# Patient Record
Sex: Female | Born: 1935 | Race: White | Hispanic: No | Smoking: Never smoker
Health system: Southern US, Community
[De-identification: ages and names within clinical notes are randomized; demographics above are authoritative.]

## PROBLEM LIST (undated history)

## (undated) DIAGNOSIS — N183 Chronic kidney disease, stage 3 unspecified: Secondary | ICD-10-CM

## (undated) DIAGNOSIS — M129 Arthropathy, unspecified: Secondary | ICD-10-CM

## (undated) DIAGNOSIS — M109 Gout, unspecified: Secondary | ICD-10-CM

## (undated) DIAGNOSIS — J45909 Unspecified asthma, uncomplicated: Secondary | ICD-10-CM

## (undated) DIAGNOSIS — Z862 Personal history of diseases of the blood and blood-forming organs and certain disorders involving the immune mechanism: Secondary | ICD-10-CM

## (undated) DIAGNOSIS — K5733 Diverticulitis of large intestine without perforation or abscess with bleeding: Secondary | ICD-10-CM

## (undated) DIAGNOSIS — E785 Hyperlipidemia, unspecified: Secondary | ICD-10-CM

## (undated) DIAGNOSIS — K219 Gastro-esophageal reflux disease without esophagitis: Secondary | ICD-10-CM

## (undated) DIAGNOSIS — I639 Cerebral infarction, unspecified: Secondary | ICD-10-CM

## (undated) DIAGNOSIS — N2889 Other specified disorders of kidney and ureter: Secondary | ICD-10-CM

## (undated) DIAGNOSIS — I10 Essential (primary) hypertension: Secondary | ICD-10-CM

## (undated) HISTORY — DX: Essential (primary) hypertension: I10

## (undated) HISTORY — DX: Chronic kidney disease, stage 3 (moderate): N18.3

## (undated) HISTORY — DX: Gastro-esophageal reflux disease without esophagitis: K21.9

## (undated) HISTORY — DX: Personal history of diseases of the blood and blood-forming organs and certain disorders involving the immune mechanism: Z86.2

## (undated) HISTORY — DX: Chronic kidney disease, stage 3 unspecified: N18.30

## (undated) HISTORY — DX: Unspecified asthma, uncomplicated: J45.909

## (undated) HISTORY — DX: Diverticulitis of large intestine without perforation or abscess with bleeding: K57.33

## (undated) HISTORY — DX: Hyperlipidemia, unspecified: E78.5

## (undated) HISTORY — DX: Arthropathy, unspecified: M12.9

## (undated) HISTORY — PX: BACK SURGERY: SHX140

## (undated) HISTORY — DX: Cerebral infarction, unspecified: I63.9

## (undated) HISTORY — PX: WRIST SURGERY: SHX841

## (undated) HISTORY — DX: Gout, unspecified: M10.9

## (undated) HISTORY — PX: DENTAL SURGERY: SHX609

## (undated) HISTORY — DX: Other specified disorders of kidney and ureter: N28.89

## (undated) HISTORY — PX: KNEE SURGERY: SHX244

---

## 2001-09-17 ENCOUNTER — Encounter: Payer: Self-pay | Admitting: Unknown Physician Specialty

## 2001-09-17 ENCOUNTER — Encounter: Admission: RE | Admit: 2001-09-17 | Discharge: 2001-09-17 | Payer: Self-pay | Admitting: Unknown Physician Specialty

## 2004-03-22 ENCOUNTER — Ambulatory Visit: Payer: Self-pay | Admitting: Family Medicine

## 2004-03-30 ENCOUNTER — Ambulatory Visit: Payer: Self-pay | Admitting: Family Medicine

## 2004-04-26 ENCOUNTER — Ambulatory Visit: Payer: Self-pay | Admitting: Family Medicine

## 2004-05-15 ENCOUNTER — Ambulatory Visit: Payer: Self-pay | Admitting: Family Medicine

## 2004-10-30 ENCOUNTER — Ambulatory Visit: Payer: Self-pay | Admitting: Family Medicine

## 2004-12-11 ENCOUNTER — Ambulatory Visit: Payer: Self-pay | Admitting: Family Medicine

## 2005-01-08 ENCOUNTER — Ambulatory Visit: Payer: Self-pay | Admitting: Family Medicine

## 2005-01-10 ENCOUNTER — Ambulatory Visit: Payer: Self-pay | Admitting: Family Medicine

## 2005-01-24 ENCOUNTER — Ambulatory Visit: Payer: Self-pay | Admitting: Family Medicine

## 2005-03-07 ENCOUNTER — Ambulatory Visit: Payer: Self-pay | Admitting: Family Medicine

## 2005-04-02 ENCOUNTER — Ambulatory Visit: Payer: Self-pay | Admitting: Family Medicine

## 2005-05-16 ENCOUNTER — Ambulatory Visit: Payer: Self-pay | Admitting: Family Medicine

## 2005-05-29 ENCOUNTER — Ambulatory Visit: Payer: Self-pay | Admitting: Family Medicine

## 2005-06-04 ENCOUNTER — Ambulatory Visit: Payer: Self-pay | Admitting: Family Medicine

## 2006-10-24 ENCOUNTER — Inpatient Hospital Stay (HOSPITAL_COMMUNITY): Admission: RE | Admit: 2006-10-24 | Discharge: 2006-10-27 | Payer: Self-pay | Admitting: Neurosurgery

## 2007-03-18 ENCOUNTER — Inpatient Hospital Stay (HOSPITAL_COMMUNITY): Admission: RE | Admit: 2007-03-18 | Discharge: 2007-03-25 | Payer: Self-pay | Admitting: Neurosurgery

## 2008-05-11 ENCOUNTER — Encounter: Payer: Self-pay | Admitting: Orthopedic Surgery

## 2008-10-06 ENCOUNTER — Encounter: Admission: RE | Admit: 2008-10-06 | Discharge: 2008-10-06 | Payer: Self-pay | Admitting: Neurosurgery

## 2010-07-25 ENCOUNTER — Ambulatory Visit
Admission: RE | Admit: 2010-07-25 | Discharge: 2010-07-25 | Disposition: A | Discharge status: Home / Self Care | Payer: Medicare Other | Source: Ambulatory Visit | Attending: Neurosurgery | Admitting: Neurosurgery

## 2010-07-25 ENCOUNTER — Other Ambulatory Visit: Payer: Self-pay | Admitting: Neurosurgery

## 2010-07-25 DIAGNOSIS — M545 Low back pain: Secondary | ICD-10-CM

## 2010-07-25 MED ORDER — GADOBENATE DIMEGLUMINE 529 MG/ML IV SOLN
8.0000 mL | Freq: Once | INTRAVENOUS | Status: AC | PRN
  Administered 2010-07-25: 8 mL via INTRAVENOUS

## 2010-08-01 NOTE — Op Note (Signed)
NAMESELYNA, Francis NO.:  [id number]   MEDICAL RECORD NO.:  [id number]          PATIENT TYPE:  INP   LOCATION:  3022                         FACILITY:  [facility]   PHYSICIAN:  Danae Orleans. Venetia Maxon, M.D.  DATE OF BIRTH:  04/08/1935   DATE OF PROCEDURE:  03/18/2007  DATE OF DISCHARGE:                               OPERATIVE REPORT   PREOPERATIVE DIAGNOSIS:  L4-5 spondylolisthesis with recurrent herniated  disk, L4-5, with stenosis and radiculopathy.   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   PROCEDURE:  1. Redo decompression L4 through L5.  2. Pedicle screw fixation L3 through L5.  3. Posterolateral arthrodesis, L3 through L5 bilaterally, with      morselized bone autograft bone morphogenic protein and Vitoss      tricalcium phosphate strips.   SURGEON:  Danae Orleans. Venetia Maxon, M.D.   ASSISTANT:  Stefani Dama, M.D. and Georgiann Cocker, R.N.   ANESTHESIA:  General endotracheal anesthesia.   ESTIMATED BLOOD LOSS:  Minimal.   COMPLICATIONS:  None.   DISPOSITION:  Recovery.   INDICATIONS:  Denise Francis is a 75 year old woman who had previously  undergone far lateral diskectomy at L4-5 on the left who developed  spondylolisthesis of L4 and L5.  It was elected to take her surgery for  redo decompression and fusion at this affected level.   PROCEDURE:  Denise Francis was brought to the operating room.  Following  satisfactory and uncomplicated induction of general endotracheal  anesthesia and placement of intravenous lines and Foley catheter, the  patient was placed in prone position on the [place] table, padding all  soft tissue and bony prominences appropriately.   Incision was made in the midline and carried through copious adipose  tissue to the lumbodorsal fascia which was incised bilaterally.  Initial  exposure was performed at  L3 through L4 and subsequently carried to the  L5 level, and this was confirmed with fluoroscopic radiograph because  PACS system was not available today, having had a system malfunction.  Subsequently, exposure was performed exposing the transverse processes  of L5 and L4 bilaterally.  Care was taken to with lateral dissection on  left side, as this had previously been operated in the far lateral  position.  Subsequently, a midline laminectomy between L4-L5 was then  performed with removal of a portion of the spinous process and  subsequently facet joint.  The facet joints were very horizontally  oriented and extended up to just above the L4 pedicle, and the L5  laminae were more vertically oriented and appeared to be somewhat  sacralized.  Exposure was performed along the course of the L4 pedicle,  and on the right side, the L4 nerve root had an anomalous course  compared to the left with a more inferior take off.  The thecal sac and  both L4-L5 nerve roots were decompressed, but it was felt at this point  that there was insufficient L3-4 facet joint intact, and I had concern  that the patient might develop  instability at this level as well.  Subsequently, it was elected first not to perform interbody graft but to  fuse the patient in situ with posterolateral arthrodesis and pedicle  screw fixation as well as to extend the pedicle screw fixation up to the  L3 level for more adequate restoration of spinal integrity.  Subsequently, the posterolateral region was decorticated, and pedicle  screws were then placed, 6.5 x 40-mm screws, with the exception of 6.5 x  45-mm screw placed at the L3 right pedicle.  All screws had excellent  purchase, and their position was confirmed on AP and lateral  fluoroscopy.  The nerve roots had been well decompressed as was the  thecal sac, and there appeared to be restoration of spinal anatomy with  reduction of the anterolisthesis of L4 and L5.  60-mm rods  were placed  and locked down in situ.  Prior to placing these, the posterolateral  region was grafted with a medium BMP and 10 mL of the Vitoss foam which  had been reconstituted with marrow-rich blood from the course of the  pedicle tract as well as decorticated bone autograft which was placed on  the right side of midline.  The pedicle screws were then locked down in  situ, and the self-retaining retractor was removed.  The lumbodorsal  fascia was closed with 1 Vicryl sutures, the subcutaneous tissues were  approximated with 2-0 Vicryl interrupted inverted sutures, and the skin  edges were reapproximated with interrupted 3-0 Vicryl subcuticular  stitch.  The wound was dressed with Benzoin and Steri-Strips, Telfa  gauze and tape.  The patient was x-rayed in the operating room and taken  to recovery in stable and satisfactory condition, having tolerated her  operation well.  Counts were correct at the end of the case.      Danae Orleans. Venetia Maxon, M.D.  Electronically Signed     JDS/MEDQ  D:  03/18/2007  T:  03/18/2007  Job:  [id number]

## 2010-08-01 NOTE — Op Note (Signed)
[name], [name] NO.:  [id number]   MEDICAL RECORD NO.:  [id number]          PATIENT TYPE:  INP   LOCATION:  3172                         FACILITY:  [facility]   PHYSICIAN:  Danae Orleans. Venetia Maxon, M.D.  DATE OF BIRTH:  1935-07-09   DATE OF PROCEDURE:  10/24/2006  DATE OF DISCHARGE:                               OPERATIVE REPORT   PREOPERATIVE DIAGNOSIS:  Far lateral herniated disc L4-5 left, with left  L4 radiculopathies, spondylolisthesis, and degenerative disease.   FINAL DIAGNOSIS:  Far lateral herniated disc L4-5 left, with left L4  radiculopathies, spondylolisthesis, and degenerative disease.   PROCEDURE:  Left L4-5 METRx far lateral microdiskectomy with  microdissection.   SURGEON:  Danae Orleans. Venetia Maxon, M.D.   ASSISTANT:  Stefani Dama, M.D.   ANESTHESIA:  General endotracheal anesthesia.   ESTIMATED BLOOD LOSS:  Minimal.   COMPLICATIONS:  None.   DISPOSITION:  Recovery.   INDICATIONS:  Denise Francis is a 75 year old woman with far lateral  disc herniation at L4-5 on the left.  It was elected to take her to  surgery for far lateral microdiskectomy using METRx system.   PROCEDURE:  [name] was brought to the operating room.  Following  a satisfactory and uncomplicated induction of general endotracheal  anesthesia and placement of intravenous lines, the patient was placed in  the prone position on Wilson frame.  Her low back was then prepped and  draped in the usual sterile fashion.  After confirming site of planned  incision with C-arm fluoroscopy, a linear incision was made  approximately 2 cm long, and then sequential dilation was performed  overlying the left L4 pars.  A 6 cm x 22 mm METRx tubular retractor was  then placed, and all muscle overlying bone was then cauterized.  Using  high-speed drill, pars interarticularis of L4 was drilled down, as was a  very hypertrophied superior articular process of the facette.  Subsequently, initial  exposure was lateral, and I exposed the left L3  nerve extra-pedicularly, but subsequent exposure was performed which  demonstrated after removing the superior articular process and the  attending the pars of L4, the very inflamed L4 nerve root was  identified, and this was significantly displaced cephalad, and under  microdissection technique the nerve root was mobilized cephalad,  exposing a very large fragment of herniated disc which  was  significantly compressing the nerve root.  Upon removal of this fragment  of disc, the nerve appeared to regain a normal position, and this  appeared to be much less compressed.  Subsequently, hemostasis was  assured.  The wound was irrigated, and the operative bed was instilled  with Depo-Medrol and fentanyl.  The METRx tubular retractor was removed.  The fascia was closed with 0 Vicryl sutures.  Subcutaneous tissues were  reapproximated with 2-0 Vicryl interrupted  inverted sutures, and the skin edges were reapproximated with a 3-0  Vicryl subcuticular stitch.  The wound was dressed with Dermabond.  The  patient was explained in the operating room and taken to recovery in  stable satisfactory condition, having tolerated the operation well.  All  counts were correct at the end of the case.      Danae Orleans. Venetia Maxon, M.D.  Electronically Signed     JDS/MEDQ  D:  10/24/2006  T:  10/25/2006  Job:  [id number]

## 2010-08-04 NOTE — Discharge Summary (Signed)
[name], [name]            ACCOUNT NO.:  [id number]   MEDICAL RECORD NO.:  [id number]          PATIENT TYPE:  INP   LOCATION:  5124                         FACILITY:  [facility]   PHYSICIAN:  Danae Orleans. Venetia Maxon, M.D.  DATE OF BIRTH:  Dec 07, 1935   DATE OF ADMISSION:  10/24/2006  DATE OF DISCHARGE:  10/27/2006                               DISCHARGE SUMMARY   REASON FOR ADMISSION:  Lumbar disk displacement, hypertension, asthma  without status asthmaticus, and spondylolisthesis.   HISTORY ILLNESS AND HOSPITAL COURSE:  Denise Francis is a 75 year old  woman with a left L4-5 bilateral disk herniation.  She was admitted to  the hospital and underwent uncomplicated left far-lateral  microdiskectomy using a METRx retractor system.  She had a large disk  herniation with significant nerve root compression.  She was slightly  sore after surgery and slowly mobilized, but gradually did well.  She  was doing well and ambulating independently.  She was discharged home on  October 27, 2006 in stable and satisfactory condition with instructions  to follow up in the office in 3 weeks for postoperative visit.   DISCHARGE DIAGNOSES:  1. Lumbar disk displacement.  2. Hypertension.  3. Asthma without status asthmaticus.  4. Spondylolisthesis.      Danae Orleans. Venetia Maxon, M.D.  Electronically Signed     JDS/MEDQ  D:  11/21/2006  T:  11/21/2006  Job:  [id number]

## 2010-08-16 HISTORY — PX: COLONOSCOPY: SHX174

## 2010-12-20 HISTORY — PX: ESOPHAGOGASTRODUODENOSCOPY: SHX1529

## 2010-12-22 LAB — CULTURE, BLOOD (ROUTINE X 2): Culture: NO GROWTH

## 2010-12-22 LAB — CBC
Hemoglobin: 9.9 — ABNORMAL LOW
MCHC: 34.2
MCHC: 34.5
MCV: 92.7
Platelets: 278
RBC: 3.13 — ABNORMAL LOW
RBC: 4.22
RDW: 12.3

## 2010-12-22 LAB — URINALYSIS, ROUTINE W REFLEX MICROSCOPIC
Bilirubin Urine: NEGATIVE
Ketones, ur: NEGATIVE
Nitrite: NEGATIVE
Protein, ur: 30 — AB
Urobilinogen, UA: 1

## 2010-12-22 LAB — URINE CULTURE
Colony Count: NO GROWTH
Culture: NO GROWTH

## 2010-12-22 LAB — TYPE AND SCREEN: ABO/RH(D): A POS

## 2010-12-22 LAB — ABO/RH: ABO/RH(D): A POS

## 2010-12-22 LAB — BASIC METABOLIC PANEL
BUN: 14
CO2: 28
Calcium: 9
Creatinine, Ser: 0.83
GFR calc Af Amer: >60
Glucose, Bld: 92

## 2011-01-01 LAB — CBC
Hemoglobin: 12.2
MCHC: 34.3
MCV: 92.2
RBC: 3.87
RDW: 13

## 2011-01-01 LAB — BASIC METABOLIC PANEL
CO2: 28
Calcium: 8.9
Chloride: 105
GFR calc Af Amer: >60
Glucose, Bld: 99
Sodium: 140

## 2011-01-01 LAB — URINALYSIS, ROUTINE W REFLEX MICROSCOPIC
Bilirubin Urine: NEGATIVE
Glucose, UA: 500 — AB
Hgb urine dipstick: NEGATIVE
Ketones, ur: NEGATIVE
Protein, ur: NEGATIVE
Urobilinogen, UA: 0.2

## 2012-10-30 DIAGNOSIS — G8929 Other chronic pain: Secondary | ICD-10-CM | POA: Insufficient documentation

## 2012-10-30 HISTORY — DX: Other chronic pain: G89.29

## 2013-04-23 DIAGNOSIS — M79643 Pain in unspecified hand: Secondary | ICD-10-CM

## 2013-04-23 HISTORY — DX: Pain in unspecified hand: M79.643

## 2014-09-15 DIAGNOSIS — Z950 Presence of cardiac pacemaker: Secondary | ICD-10-CM | POA: Insufficient documentation

## 2014-09-15 DIAGNOSIS — E7800 Pure hypercholesterolemia, unspecified: Secondary | ICD-10-CM

## 2014-09-15 DIAGNOSIS — I272 Pulmonary hypertension, unspecified: Secondary | ICD-10-CM | POA: Insufficient documentation

## 2014-09-15 DIAGNOSIS — I10 Essential (primary) hypertension: Secondary | ICD-10-CM | POA: Insufficient documentation

## 2014-09-15 DIAGNOSIS — I110 Hypertensive heart disease with heart failure: Secondary | ICD-10-CM | POA: Insufficient documentation

## 2014-09-15 HISTORY — DX: Essential (primary) hypertension: I10

## 2014-09-15 HISTORY — DX: Pure hypercholesterolemia, unspecified: E78.00

## 2014-09-15 HISTORY — DX: Presence of cardiac pacemaker: Z95.0

## 2014-09-15 HISTORY — DX: Pulmonary hypertension, unspecified: I27.20

## 2015-02-02 DIAGNOSIS — R001 Bradycardia, unspecified: Secondary | ICD-10-CM

## 2015-02-02 HISTORY — DX: Bradycardia, unspecified: R00.1

## 2015-04-01 DIAGNOSIS — D509 Iron deficiency anemia, unspecified: Secondary | ICD-10-CM

## 2015-06-01 ENCOUNTER — Other Ambulatory Visit: Payer: Self-pay | Admitting: Anesthesiology

## 2015-06-01 DIAGNOSIS — M4726 Other spondylosis with radiculopathy, lumbar region: Secondary | ICD-10-CM

## 2015-06-02 ENCOUNTER — Ambulatory Visit
Admission: RE | Admit: 2015-06-02 | Discharge: 2015-06-02 | Disposition: A | Discharge status: Home / Self Care | Payer: Medicare Other | Source: Ambulatory Visit | Attending: Anesthesiology | Admitting: Anesthesiology

## 2015-06-02 DIAGNOSIS — M4726 Other spondylosis with radiculopathy, lumbar region: Secondary | ICD-10-CM

## 2015-06-02 MED ORDER — IOHEXOL 180 MG/ML  SOLN
15.0000 mL | Freq: Once | INTRAMUSCULAR | Status: AC | PRN
  Administered 2015-06-02: 15 mL via INTRAVENOUS

## 2015-06-02 NOTE — Progress Notes (Signed)
Patient states she took two of  her own Valium 2mg  PO tablets at home.  Donell SievertJeanne Lohr, RN

## 2015-06-02 NOTE — Discharge Instructions (Signed)

## 2015-07-05 DIAGNOSIS — N183 Chronic kidney disease, stage 3 unspecified: Secondary | ICD-10-CM

## 2015-07-05 DIAGNOSIS — N1832 Chronic kidney disease, stage 3b: Secondary | ICD-10-CM | POA: Insufficient documentation

## 2015-07-05 HISTORY — DX: Chronic kidney disease, stage 3 unspecified: N18.30

## 2015-07-05 HISTORY — DX: Chronic kidney disease, stage 3b: N18.32

## 2015-08-12 DIAGNOSIS — G4733 Obstructive sleep apnea (adult) (pediatric): Secondary | ICD-10-CM

## 2015-08-12 DIAGNOSIS — N2581 Secondary hyperparathyroidism of renal origin: Secondary | ICD-10-CM | POA: Insufficient documentation

## 2015-08-12 DIAGNOSIS — M810 Age-related osteoporosis without current pathological fracture: Secondary | ICD-10-CM

## 2015-08-12 DIAGNOSIS — I495 Sick sinus syndrome: Secondary | ICD-10-CM

## 2015-08-12 DIAGNOSIS — G4700 Insomnia, unspecified: Secondary | ICD-10-CM

## 2015-08-12 DIAGNOSIS — G2581 Restless legs syndrome: Secondary | ICD-10-CM

## 2015-08-12 DIAGNOSIS — K219 Gastro-esophageal reflux disease without esophagitis: Secondary | ICD-10-CM | POA: Insufficient documentation

## 2015-08-12 DIAGNOSIS — Z9989 Dependence on other enabling machines and devices: Secondary | ICD-10-CM

## 2015-08-12 HISTORY — DX: Insomnia, unspecified: G47.00

## 2015-08-12 HISTORY — DX: Secondary hyperparathyroidism of renal origin: N25.81

## 2015-08-12 HISTORY — DX: Restless legs syndrome: G25.81

## 2015-08-12 HISTORY — DX: Obstructive sleep apnea (adult) (pediatric): G47.33

## 2015-08-12 HISTORY — DX: Sick sinus syndrome: I49.5

## 2015-08-12 HISTORY — DX: Age-related osteoporosis without current pathological fracture: M81.0

## 2015-09-06 DIAGNOSIS — Z8659 Personal history of other mental and behavioral disorders: Secondary | ICD-10-CM

## 2015-09-06 DIAGNOSIS — F321 Major depressive disorder, single episode, moderate: Secondary | ICD-10-CM

## 2015-09-06 DIAGNOSIS — F3340 Major depressive disorder, recurrent, in remission, unspecified: Secondary | ICD-10-CM

## 2015-09-06 HISTORY — DX: Major depressive disorder, single episode, moderate: F32.1

## 2015-09-06 HISTORY — DX: Major depressive disorder, recurrent, in remission, unspecified: F33.40

## 2015-09-06 HISTORY — DX: Personal history of other mental and behavioral disorders: Z86.59

## 2015-09-27 DIAGNOSIS — M1990 Unspecified osteoarthritis, unspecified site: Secondary | ICD-10-CM

## 2015-09-27 HISTORY — DX: Unspecified osteoarthritis, unspecified site: M19.90

## 2015-09-29 DIAGNOSIS — D509 Iron deficiency anemia, unspecified: Secondary | ICD-10-CM | POA: Diagnosis not present

## 2016-01-05 DIAGNOSIS — M48062 Spinal stenosis, lumbar region with neurogenic claudication: Secondary | ICD-10-CM

## 2016-01-05 HISTORY — DX: Spinal stenosis, lumbar region with neurogenic claudication: M48.062

## 2016-03-21 DIAGNOSIS — M6530 Trigger finger, unspecified finger: Secondary | ICD-10-CM | POA: Insufficient documentation

## 2016-03-21 DIAGNOSIS — M5412 Radiculopathy, cervical region: Secondary | ICD-10-CM

## 2016-03-21 HISTORY — DX: Radiculopathy, cervical region: M54.12

## 2016-03-21 HISTORY — DX: Trigger finger, unspecified finger: M65.30

## 2016-04-02 DIAGNOSIS — D509 Iron deficiency anemia, unspecified: Secondary | ICD-10-CM | POA: Diagnosis not present

## 2016-04-07 DIAGNOSIS — J309 Allergic rhinitis, unspecified: Secondary | ICD-10-CM

## 2016-04-07 DIAGNOSIS — A809 Acute poliomyelitis, unspecified: Secondary | ICD-10-CM

## 2016-04-07 DIAGNOSIS — D509 Iron deficiency anemia, unspecified: Secondary | ICD-10-CM

## 2016-04-07 HISTORY — DX: Iron deficiency anemia, unspecified: D50.9

## 2016-04-07 HISTORY — DX: Allergic rhinitis, unspecified: J30.9

## 2016-04-07 HISTORY — DX: Acute poliomyelitis, unspecified: A80.9

## 2016-04-17 DIAGNOSIS — M25531 Pain in right wrist: Secondary | ICD-10-CM

## 2016-04-17 DIAGNOSIS — M25532 Pain in left wrist: Secondary | ICD-10-CM

## 2016-04-17 HISTORY — DX: Pain in left wrist: M25.532

## 2016-04-17 HISTORY — DX: Pain in right wrist: M25.531

## 2016-04-30 DIAGNOSIS — M353 Polymyalgia rheumatica: Secondary | ICD-10-CM | POA: Insufficient documentation

## 2016-04-30 HISTORY — DX: Polymyalgia rheumatica: M35.3

## 2016-05-21 DIAGNOSIS — J4530 Mild persistent asthma, uncomplicated: Secondary | ICD-10-CM

## 2016-05-21 HISTORY — DX: Mild persistent asthma, uncomplicated: J45.30

## 2016-06-04 DIAGNOSIS — D509 Iron deficiency anemia, unspecified: Secondary | ICD-10-CM | POA: Diagnosis not present

## 2016-12-05 DIAGNOSIS — K625 Hemorrhage of anus and rectum: Secondary | ICD-10-CM

## 2016-12-05 DIAGNOSIS — Z862 Personal history of diseases of the blood and blood-forming organs and certain disorders involving the immune mechanism: Secondary | ICD-10-CM

## 2016-12-27 ENCOUNTER — Other Ambulatory Visit: Payer: Self-pay | Admitting: Cardiology

## 2016-12-27 ENCOUNTER — Telehealth: Payer: Self-pay

## 2016-12-27 NOTE — Telephone Encounter (Signed)
Informed patient of the RX request and that appointment would need to be established with the new office before refill can occur. Appointment was set and month refill was sent. Denise Francis

## 2017-01-04 DIAGNOSIS — K625 Hemorrhage of anus and rectum: Secondary | ICD-10-CM

## 2017-01-04 HISTORY — DX: Hemorrhage of anus and rectum: K62.5

## 2017-01-31 ENCOUNTER — Ambulatory Visit: Payer: Medicare Other | Admitting: Cardiology

## 2017-02-01 ENCOUNTER — Other Ambulatory Visit: Payer: Self-pay

## 2017-02-04 ENCOUNTER — Ambulatory Visit (INDEPENDENT_AMBULATORY_CARE_PROVIDER_SITE_OTHER): Payer: Medicare Other | Admitting: Cardiology

## 2017-02-04 ENCOUNTER — Encounter: Payer: Self-pay | Admitting: Cardiology

## 2017-02-04 VITALS — BP 122/78 | HR 87 | Ht 65.0 in | Wt 188.1 lb

## 2017-02-04 DIAGNOSIS — M353 Polymyalgia rheumatica: Secondary | ICD-10-CM

## 2017-02-04 DIAGNOSIS — Z950 Presence of cardiac pacemaker: Secondary | ICD-10-CM | POA: Diagnosis not present

## 2017-02-04 DIAGNOSIS — I10 Essential (primary) hypertension: Secondary | ICD-10-CM | POA: Diagnosis not present

## 2017-02-04 DIAGNOSIS — I272 Pulmonary hypertension, unspecified: Secondary | ICD-10-CM

## 2017-02-04 NOTE — Patient Instructions (Signed)

## 2017-02-04 NOTE — Progress Notes (Signed)
Cardiology Office Note:    Date:  02/04/2017   ID:  Denise Francis, DOB 09/01/1935, MRN [id number]  PCP:  Olive Bassough, Robert L, MD  Cardiologist:  Garwin Brothersajan R Revankar, MD   Referring MD: Olive Bassough, Robert L, MD    ASSESSMENT:    1. Essential hypertension   2. Pulmonary hypertension (HCC)   3. Artificial cardiac pacemaker   4. PMR (polymyalgia rheumatica) (HCC)    PLAN:    In order of problems listed above:  1. I discussed my findings with the patient extensively.  Stressed with patient primary prevention at extensive length.  Her blood pressure is stable. 2. She has undergone pacemaker checks and these have come to be fine.  She is going to have one in the next few months and [place] have [place].  I respect her wishes. 3. Diet was discussed for dyslipidemia and obesity.  Lipids are followed by primary care physician.  He will be seen in follow-up appointment in 6 months or earlier if she has any concerns.   Medication Adjustments/Labs and Tests Ordered: Current medicines are reviewed at length with the patient today.  Concerns regarding medicines are outlined above.  Orders Placed This Encounter  Procedures  . EKG 12-Lead   No orders of the defined types were placed in this encounter.    History of Present Illness:    Denise Francis is a 81 y.o. female who is being seen today for the evaluation of essential hypertension and pulmonary hypertension at the request of Dough, Doris Cheadleobert L, MD.  Patient is a pleasant 81 year old female.  She has a past medical history of sick sinus syndrome and permanent pacing, essential hypertension.  She denies any problems at this time and takes care of activities of daily living.  No chest pain orthopnea or PND.  She has been diagnosed with polymyalgia rheumatica and is being treated with steroids which have been tapered at this time.  At the time of my evaluation, the patient is alert awake oriented and in no distress.  Past  Medical History:  Diagnosis Date  . Hyperlipidemia   . Hypertension     History reviewed. No pertinent surgical history.  Current Medications: Current Meds  Medication Sig  . alendronate (FOSAMAX) 70 MG tablet Take 70 mg once a week by mouth.  Marland Kitchen. aspirin EC 81 MG tablet Take 81 mg by mouth.  Marland Kitchen. atorvastatin (LIPITOR) 10 MG tablet TAKE 1 TABLET DAILY  . calcitRIOL (ROCALTROL) 0.25 MCG capsule Take 0.25 mcg daily by mouth.  . Calcium Carbonate-Vitamin D (CALCIUM-VITAMIN D) 500-200 MG-UNIT tablet Take by mouth.  . cetirizine (ZYRTEC) 10 MG tablet Take 10 mg by mouth.  . conjugated estrogens (PREMARIN) vaginal cream Place vaginally.  . diazepam (VALIUM) 2 MG tablet Take 2 mg by mouth every 6 (six) hours as needed for anxiety.  . diazepam (VALIUM) 5 MG tablet Take 5 mg by mouth every 6 (six) hours as needed for anxiety.  . fluticasone (FLONASE) 50 MCG/ACT nasal spray   . furosemide (LASIX) 20 MG tablet Take 20 mg by mouth.  . gabapentin (NEURONTIN) 300 MG capsule Take 300 mg daily by mouth.  . montelukast (SINGULAIR) 10 MG tablet Take 10 mg by mouth.  . olmesartan (BENICAR) 40 MG tablet   . omeprazole (PRILOSEC) 20 MG capsule Take 20 mg daily by mouth.  . predniSONE (DELTASONE) 1 MG tablet Take 0.5 mg daily by mouth.  Marland Kitchen. rOPINIRole (REQUIP) 0.25 MG tablet   .  traMADol (ULTRAM) 50 MG tablet Take 50 mg daily as needed by mouth.  . verapamil (CALAN-SR) 180 MG CR tablet Take 180 mg daily by mouth.  . [DISCONTINUED] verapamil (CALAN-SR) 180 MG CR tablet      Allergies:   Amoxicillin; Clarithromycin; and Flagyl [metronidazole]   Social History   Socioeconomic History  . Marital status: Widowed    Spouse name: None  . Number of children: None  . Years of education: None  . Highest education level: None  Social Needs  . Financial resource strain: None  . Food insecurity - worry: None  . Food insecurity - inability: None  . Transportation needs - medical: None  . Transportation needs -  non-medical: None  Occupational History  . None  Tobacco Use  . Smoking status: Never Smoker  . Smokeless tobacco: Never Used  Substance and Sexual Activity  . Alcohol use: No    Alcohol/week: 0.0 oz    Frequency: Never  . Drug use: No  . Sexual activity: None  Other Topics Concern  . None  Social History Narrative  . None     Family History: The patient's family history is not on file.  ROS:   Please see the history of present illness.    All other systems reviewed and are negative.  EKGs/Labs/Other Studies Reviewed:    The following studies were reviewed today: I reviewed patient's previous office records and also pacemaker check an echo to discuss this with her.  The patient underwent sinus rhythm nonspecific ST-T changes.  Patient is not dependent on the pacemaker.  Pacemaker check reports were discussed with the patient at length   Recent Labs: No results found for requested labs within last 8760 hours.  Recent Lipid Panel No results found for: CHOL, TRIG, HDL, CHOLHDL, VLDL, LDLCALC, LDLDIRECT  Physical Exam:    VS:  BP 122/78   Pulse 87   Ht 5\' 5"  (1.651 m)   Wt 188 lb 1.9 oz (85.3 kg)   SpO2 94%   BMI 31.30 kg/m     Wt Readings from Last 3 Encounters:  02/04/17 188 lb 1.9 oz (85.3 kg)  06/02/15 180 lb (81.6 kg)     GEN: Patient is in no acute distress HEENT: Normal NECK: No JVD; No carotid bruits LYMPHATICS: No lymphadenopathy CARDIAC: S1 S2 regular, 2/6 systolic murmur at the apex. RESPIRATORY:  Clear to auscultation without rales, wheezing or rhonchi  ABDOMEN: Soft, non-tender, non-distended MUSCULOSKELETAL:  No edema; No deformity  SKIN: Warm and dry NEUROLOGIC:  Alert and oriented x 3 PSYCHIATRIC:  Normal affect    Signed, Garwin Brothersajan R Revankar, MD  02/04/2017 12:02 PM    [facility]

## 2017-02-10 ENCOUNTER — Other Ambulatory Visit: Payer: Self-pay | Admitting: Cardiology

## 2017-02-15 DIAGNOSIS — N3000 Acute cystitis without hematuria: Secondary | ICD-10-CM

## 2017-02-15 HISTORY — DX: Acute cystitis without hematuria: N30.00

## 2017-03-21 ENCOUNTER — Other Ambulatory Visit: Payer: Self-pay | Admitting: Cardiology

## 2017-05-13 ENCOUNTER — Other Ambulatory Visit: Payer: Self-pay

## 2017-05-13 ENCOUNTER — Telehealth: Payer: Self-pay | Admitting: Cardiology

## 2017-05-13 DIAGNOSIS — Z950 Presence of cardiac pacemaker: Secondary | ICD-10-CM

## 2017-05-13 NOTE — Telephone Encounter (Signed)
Is due to have a pacemaker check in March

## 2017-05-13 NOTE — Telephone Encounter (Signed)
AMB referral to EP with Dr. Elberta Fortisamnitz. [facility]. Patient informed via voicemail.

## 2017-06-04 DIAGNOSIS — Z862 Personal history of diseases of the blood and blood-forming organs and certain disorders involving the immune mechanism: Secondary | ICD-10-CM | POA: Diagnosis not present

## 2017-06-04 DIAGNOSIS — K625 Hemorrhage of anus and rectum: Secondary | ICD-10-CM | POA: Diagnosis not present

## 2017-06-05 ENCOUNTER — Ambulatory Visit (INDEPENDENT_AMBULATORY_CARE_PROVIDER_SITE_OTHER): Payer: Medicare Other | Admitting: Cardiology

## 2017-06-05 ENCOUNTER — Encounter: Payer: Self-pay | Admitting: Cardiology

## 2017-06-05 VITALS — BP 122/76 | HR 79 | Ht 66.0 in | Wt 181.0 lb

## 2017-06-05 DIAGNOSIS — I10 Essential (primary) hypertension: Secondary | ICD-10-CM

## 2017-06-05 DIAGNOSIS — I495 Sick sinus syndrome: Secondary | ICD-10-CM | POA: Diagnosis not present

## 2017-06-05 DIAGNOSIS — G4733 Obstructive sleep apnea (adult) (pediatric): Secondary | ICD-10-CM

## 2017-06-05 NOTE — Patient Instructions (Signed)
Medication Instructions:  Your physician recommends that you continue on your current medications as directed. Please refer to the Current Medication list given to you today.  Labwork: None ordered  Testing/Procedures: None ordered  Follow-Up: Your physician recommends that you schedule a follow-up appointment in: 3 months with Dr. [place]   * If you need a refill on your cardiac medications before your next appointment, please call your pharmacy.   *Please note that any paperwork needing to be filled out by the provider will need to be addressed at the front desk prior to seeing the provider. Please note that any FMLA, disability or other documents regarding health condition is subject to a $25.00 charge that must be received prior to completion of paperwork in the form of a money order or check.  Thank you for choosing [facility]!!   Dory HornSherri Adilynne Fitzwater, RN [phone]

## 2017-06-05 NOTE — Progress Notes (Addendum)
Electrophysiology Office Note   Date:  06/05/2017   ID:  Denise Francis, Denise Francis 05-Sep-1935, MRN [id number]  PCP:  Olive Bass, MD  Cardiologist:  Revankar Primary Electrophysiologist:  Will Jorja Loa, MD    Chief Complaint  Patient presents with  . Pacemaker Check    Sinus node dysfunction     History of Present Illness: Denise Francis is a 82 y.o. female who is being seen today for the evaluation of pacemaker at the request of Glean Hess Revankar. Presenting today for electrophysiology evaluation.  She has a history of hypertension, pulmonary hypertension, polymyalgia rheumatica, and has a Medtronic dual-chamber pacemaker planted for sick sinus syndrome.    Today, denies symptoms of palpitations, chest pain, shortness of breath, orthopnea, PND, lower extremity edema, claudication, dizziness, presyncope, syncope, bleeding, or neurologic sequela. The patient is tolerating medications without difficulties.  She is overall been feeling well.  She has had a few episodes of significant weakness, for over the last few months.  Device interrogation shows no major episodes.  I have asked her to check her blood pressure when these happen.  Otherwise she has done well without major complaint.    Past Medical History:  Diagnosis Date  . Hyperlipidemia   . Hypertension    History reviewed. No pertinent surgical history.   Current Outpatient Medications  Medication Sig Dispense Refill  . alendronate (FOSAMAX) 70 MG tablet Take 70 mg once a week by mouth.    Marland Kitchen aspirin EC 81 MG tablet Take 81 mg by mouth.    Marland Kitchen atorvastatin (LIPITOR) 10 MG tablet TAKE 1 TABLET DAILY 90 tablet 1  . BENICAR 40 MG tablet TAKE 1 TABLET DAILY 90 tablet 1  . calcitRIOL (ROCALTROL) 0.25 MCG capsule Take 0.25 mcg daily by mouth.    . cetirizine (ZYRTEC) 10 MG tablet Take 10 mg by mouth.    . diazepam (VALIUM) 2 MG tablet Take 2 mg by mouth every 6 (six) hours as needed for anxiety.    . fluticasone  (FLONASE) 50 MCG/ACT nasal spray     . gabapentin (NEURONTIN) 300 MG capsule Take 300 mg daily by mouth.    . montelukast (SINGULAIR) 10 MG tablet Take 10 mg by mouth.    Marland Kitchen omeprazole (PRILOSEC) 20 MG capsule Take 20 mg daily by mouth.    . predniSONE (DELTASONE) 1 MG tablet Take 0.5 mg daily by mouth.    Marland Kitchen rOPINIRole (REQUIP) 0.25 MG tablet     . traMADol (ULTRAM) 50 MG tablet Take 50 mg daily as needed by mouth.    . verapamil (CALAN-SR) 180 MG CR tablet Take 180 mg daily by mouth.     No current facility-administered medications for this visit.     Allergies:   Amoxicillin; Clarithromycin; and Flagyl [metronidazole]   Social History:  The patient  reports that  has never smoked. she has never used smokeless tobacco. She reports that she does not drink alcohol or use drugs.   Family History:  The patient's family history includes COPD in her mother; Heart attack in her mother; Heart failure in her father; Kidney failure in her father.    ROS:  Please see the history of present illness.   Otherwise, review of systems is positive for none.   All other systems are reviewed and negative.   PHYSICAL EXAM: VS:  BP 122/76   Pulse 79   Ht 5\' 6"  (1.676 m)   Wt 181 lb (82.1 kg)   SpO2  95%   BMI 29.21 kg/m  , BMI Body mass index is 29.21 kg/m. GEN: Well nourished, well developed, in no acute distress  HEENT: normal  Neck: no JVD, carotid bruits, or masses Cardiac: RRR; no murmurs, rubs, or gallops,no edema  Respiratory:  clear to auscultation bilaterally, normal work of breathing GI: soft, nontender, nondistended, + BS MS: no deformity or atrophy  Skin: warm and dry, device site well healed Neuro:  Strength and sensation are intact Psych: euthymic mood, full affect  EKG:  EKG is not ordered today. Personal review of the ekg ordered 02/04/17 shows atrial paced, rate 71  Personal review of the device interrogation today. Results in [facility]    Recent Labs: No results found for  requested labs within last 8760 hours.    Lipid Panel  No results found for: CHOL, TRIG, HDL, CHOLHDL, VLDL, LDLCALC, LDLDIRECT   Wt Readings from Last 3 Encounters:  06/05/17 181 lb (82.1 kg)  02/04/17 188 lb 1.9 oz (85.3 kg)  06/02/15 180 lb (81.6 kg)      Other studies Reviewed: Additional studies/ records that were reviewed today include: Epic notes  ASSESSMENT AND PLAN:  1.  Sick sinus syndrome: Status post Medtronic dual-chamber pacemaker.  Device functioning without issue.  Has 9 years left on her battery.  No changes at this time.  2.  Hypertension: Blood pressure well controlled.  No changes.  3.  Obstructive sleep apnea: Per primary physician.   Current medicines are reviewed at length with the patient today.   The patient does not have concerns regarding her medicines.  The following changes were made today: None  Labs/ tests ordered today include:  No orders of the defined types were placed in this encounter.    Disposition:   FU with Will Camnitz 12 months  Signed, Will Jorja LoaMartin Camnitz, MD  06/05/2017 3:51 PM     [facility] [address] [place] [address] [phone] (office) [phone] (fax)

## 2017-07-23 ENCOUNTER — Other Ambulatory Visit: Payer: Self-pay | Admitting: Cardiology

## 2017-08-06 ENCOUNTER — Ambulatory Visit: Payer: Medicare Other | Admitting: Cardiology

## 2017-08-20 ENCOUNTER — Ambulatory Visit (INDEPENDENT_AMBULATORY_CARE_PROVIDER_SITE_OTHER): Payer: Medicare Other | Admitting: Cardiology

## 2017-08-20 ENCOUNTER — Encounter: Payer: Self-pay | Admitting: Cardiology

## 2017-08-20 VITALS — BP 124/72 | HR 76 | Ht 66.0 in | Wt 178.0 lb

## 2017-08-20 DIAGNOSIS — Z950 Presence of cardiac pacemaker: Secondary | ICD-10-CM | POA: Insufficient documentation

## 2017-08-20 DIAGNOSIS — I10 Essential (primary) hypertension: Secondary | ICD-10-CM | POA: Diagnosis not present

## 2017-08-20 DIAGNOSIS — E782 Mixed hyperlipidemia: Secondary | ICD-10-CM

## 2017-08-20 HISTORY — DX: Presence of cardiac pacemaker: Z95.0

## 2017-08-20 HISTORY — DX: Mixed hyperlipidemia: E78.2

## 2017-08-20 NOTE — Patient Instructions (Signed)
Medication Instructions:  Your physician recommends that you continue on your current medications as directed. Please refer to the Current Medication list given to you today.  Labwork: None  Testing/Procedures: None  Follow-Up: Your physician recommends that you schedule a follow-up appointment in: 6 months  Any Other Special Instructions Will Be Listed Below (If Applicable).     If you need a refill on your cardiac medications before your next appointment, please call your pharmacy.   [facility]  Marlos Carmen A, RN, BSN  

## 2017-08-20 NOTE — Progress Notes (Signed)
Cardiology Office Note:    Date:  08/20/2017   ID:  Denise RogersSybil B Francis, DOB 1935/05/14, MRN [id number]  PCP:  Olive Bassough, Robert L, MD  Cardiologist:  Garwin Brothersajan R Vangie Henthorn, MD   Referring MD: Olive Bassough, Robert L, MD    ASSESSMENT:    1. Essential hypertension   2. Mixed dyslipidemia   3. Presence of permanent cardiac pacemaker    PLAN:    In order of problems listed above:  1. I discussed my findings with the patient at extensive length.  Primary prevention stressed with the patient at length.  Her blood pressure is stable and diet was discussed for dyslipidemia. 2. I mentioned to her that she has an upcoming appointment with our electrophysiology colleagues and that her pacemaker needs to be interrogated to see if she has any issues with arrhythmias that may be causing her symptoms and she is agreeable and she will discuss this with her EP doctor when she sees him next. 3. Patient will be seen in follow-up appointment in 6 months or earlier if the patient has any concerns    Medication Adjustments/Labs and Tests Ordered: Current medicines are reviewed at length with the patient today.  Concerns regarding medicines are outlined above.  No orders of the defined types were placed in this encounter.  No orders of the defined types were placed in this encounter.    Chief Complaint  Patient presents with  . Follow-up     History of Present Illness:    Denise Francis is a 82 y.o. female.  Patient has past medical history of essential hypertension and dyslipidemia.  She has a permanent pacemaker.  She denies any problems at this time and takes care of activities of daily living.  Her only issue is that a couple of times during the year she has felt a little drained out and tired and feels that she had a feeling of possibly passing out.  She has never passed out or had a syncopal episode.  She is a very active lady.  Past Medical History:  Diagnosis Date  . Hyperlipidemia   .  Hypertension     History reviewed. No pertinent surgical history.  Current Medications: Current Meds  Medication Sig  . aspirin EC 81 MG tablet Take 81 mg by mouth daily.   Marland Kitchen. atorvastatin (LIPITOR) 10 MG tablet TAKE 1 TABLET DAILY  . BENICAR 40 MG tablet TAKE 1 TABLET DAILY  . calcitRIOL (ROCALTROL) 0.25 MCG capsule Take 0.25 mcg daily by mouth.  . cetirizine (ZYRTEC) 10 MG tablet Take 10 mg by mouth daily.   . diazepam (VALIUM) 2 MG tablet Take 2 mg by mouth every 6 (six) hours as needed for anxiety.  . fluticasone (FLONASE) 50 MCG/ACT nasal spray Place 2 sprays into both nostrils daily.   Marland Kitchen. gabapentin (NEURONTIN) 300 MG capsule Take 300 mg daily by mouth.  . montelukast (SINGULAIR) 10 MG tablet Take 10 mg by mouth at bedtime.   Marland Kitchen. omeprazole (PRILOSEC) 20 MG capsule Take 20 mg daily by mouth.  . predniSONE (DELTASONE) 1 MG tablet Take 0.5 mg daily by mouth.  . ranitidine (ZANTAC) 150 MG tablet Take 1 tablet by mouth daily.  Marland Kitchen. rOPINIRole (REQUIP) 0.25 MG tablet Take 0.25 mg by mouth daily.   . traMADol (ULTRAM) 50 MG tablet Take 50 mg daily as needed by mouth.  . verapamil (CALAN-SR) 180 MG CR tablet Take 180 mg daily by mouth.     Allergies:   Amoxicillin; Clarithromycin;  Flagyl [metronidazole]; and Sulfamethoxazole-trimethoprim   Social History   Socioeconomic History  . Marital status: Widowed    Spouse name: Not on file  . Number of children: Not on file  . Years of education: Not on file  . Highest education level: Not on file  Occupational History  . Not on file  Social Needs  . Financial resource strain: Not on file  . Food insecurity:    Worry: Not on file    Inability: Not on file  . Transportation needs:    Medical: Not on file    Non-medical: Not on file  Tobacco Use  . Smoking status: Never Smoker  . Smokeless tobacco: Never Used  Substance and Sexual Activity  . Alcohol use: No    Alcohol/week: 0.0 oz    Frequency: Never  . Drug use: No  . Sexual  activity: Not on file  Lifestyle  . Physical activity:    Days per week: Not on file    Minutes per session: Not on file  . Stress: Not on file  Relationships  . Social connections:    Talks on phone: Not on file    Gets together: Not on file    Attends religious service: Not on file    Active member of club or organization: Not on file    Attends meetings of clubs or organizations: Not on file    Relationship status: Not on file  Other Topics Concern  . Not on file  Social History Narrative  . Not on file     Family History: The patient's family history includes COPD in her mother; Heart attack in her mother; Heart failure in her father; Kidney failure in her father.  ROS:   Please see the history of present illness.    All other systems reviewed and are negative.  EKGs/Labs/Other Studies Reviewed:    The following studies were reviewed today: I discussed results of previous evaluation with her at length.  Next   Recent Labs: No results found for requested labs within last 8760 hours.  Recent Lipid Panel No results found for: CHOL, TRIG, HDL, CHOLHDL, VLDL, LDLCALC, LDLDIRECT  Physical Exam:    VS:  BP 124/72 (BP Location: Left Arm, Patient Position: Sitting, Cuff Size: Normal)   Pulse 76   Ht 5\' 6"  (1.676 m)   Wt 178 lb (80.7 kg)   SpO2 97%   BMI 28.73 kg/m     Wt Readings from Last 3 Encounters:  08/20/17 178 lb (80.7 kg)  06/05/17 181 lb (82.1 kg)  02/04/17 188 lb 1.9 oz (85.3 kg)     GEN: Patient is in no acute distress HEENT: Normal NECK: No JVD; No carotid bruits LYMPHATICS: No lymphadenopathy CARDIAC: Hear sounds regular, 2/6 systolic murmur at the apex. RESPIRATORY:  Clear to auscultation without rales, wheezing or rhonchi  ABDOMEN: Soft, non-tender, non-distended MUSCULOSKELETAL:  No edema; No deformity  SKIN: Warm and dry NEUROLOGIC:  Alert and oriented x 3 PSYCHIATRIC:  Normal affect   Signed, Garwin Brothers, MD  08/20/2017 2:02 PM      [facility]

## 2017-08-22 ENCOUNTER — Telehealth: Payer: Self-pay | Admitting: *Deleted

## 2017-08-22 NOTE — Telephone Encounter (Signed)
I received a message that Ms. Denise Francis had returned her home PPM monitor to the [place] office due to change of provider and she needs a new monitor.  LMOM to return call to [facility].

## 2017-08-27 DIAGNOSIS — R739 Hyperglycemia, unspecified: Secondary | ICD-10-CM

## 2017-08-27 DIAGNOSIS — E119 Type 2 diabetes mellitus without complications: Secondary | ICD-10-CM

## 2017-08-27 DIAGNOSIS — R7303 Prediabetes: Secondary | ICD-10-CM

## 2017-08-27 HISTORY — DX: Hyperglycemia, unspecified: R73.9

## 2017-08-27 HISTORY — DX: Type 2 diabetes mellitus without complications: E11.9

## 2017-08-27 HISTORY — DX: Prediabetes: R73.03

## 2017-09-04 ENCOUNTER — Telehealth: Payer: Self-pay | Admitting: Cardiology

## 2017-09-04 ENCOUNTER — Encounter: Payer: Medicare Other | Admitting: *Deleted

## 2017-09-04 NOTE — Telephone Encounter (Signed)
Spoke with pt and reminded pt of remote transmission that is due today. Pt verbalized understanding.   

## 2017-09-05 ENCOUNTER — Encounter: Payer: Self-pay | Admitting: Cardiology

## 2017-09-11 ENCOUNTER — Encounter: Payer: Self-pay | Admitting: Cardiology

## 2017-09-11 ENCOUNTER — Ambulatory Visit (INDEPENDENT_AMBULATORY_CARE_PROVIDER_SITE_OTHER): Payer: Medicare Other | Admitting: Cardiology

## 2017-09-11 VITALS — BP 117/78 | HR 80 | Ht 65.5 in | Wt 177.0 lb

## 2017-09-11 DIAGNOSIS — I495 Sick sinus syndrome: Secondary | ICD-10-CM

## 2017-09-11 DIAGNOSIS — G4733 Obstructive sleep apnea (adult) (pediatric): Secondary | ICD-10-CM | POA: Diagnosis not present

## 2017-09-11 DIAGNOSIS — I10 Essential (primary) hypertension: Secondary | ICD-10-CM | POA: Diagnosis not present

## 2017-09-11 NOTE — Progress Notes (Signed)
Electrophysiology Office Note   Date:  09/11/2017   ID:  AUSET FRITZLER, DOB 1935-05-03, MRN [id number]  PCP:  Olive Bass, MD  Cardiologist:  Revankar Primary Electrophysiologist:  Denise Francis Denise Loa, MD    Chief Complaint  Patient presents with  . Pacemaker Check    Sick sinus syndrome     History of Present Illness: Denise Francis is a 82 y.o. female who is being seen today for the evaluation of pacemaker at the request of Glean Hess Revankar. Presenting today for electrophysiology evaluation.  She has a history of hypertension, pulmonary hypertension, polymyalgia rheumatica, and has a Medtronic dual-chamber pacemaker planted for sick sinus syndrome.  Today, denies symptoms of palpitations, chest pain, shortness of breath, orthopnea, PND, lower extremity edema, claudication, dizziness, presyncope, syncope, bleeding, or neurologic sequela. The patient is tolerating medications without difficulties.  Overall she is feeling well.  She has had a few episodes where she gets quite fatigued.  Device interrogation shows no issues.  She has been unable to check her blood pressure when this occurs due to location of her blood pressure cuff.  I have asked her to check her blood pressure when these occur as this might give an idea of her episodes.  Past Medical History:  Diagnosis Date  . Hyperlipidemia   . Hypertension    No past surgical history on file.   Current Outpatient Medications  Medication Sig Dispense Refill  . aspirin EC 81 MG tablet Take 81 mg by mouth daily.     Marland Kitchen atorvastatin (LIPITOR) 10 MG tablet TAKE 1 TABLET DAILY 90 tablet 1  . BENICAR 40 MG tablet TAKE 1 TABLET DAILY 90 tablet 1  . calcitRIOL (ROCALTROL) 0.25 MCG capsule Take 0.25 mcg daily by mouth.    . cetirizine (ZYRTEC) 10 MG tablet Take 10 mg by mouth daily.     . diazepam (VALIUM) 2 MG tablet Take 2 mg by mouth every 6 (six) hours as needed for anxiety.    . fluticasone (FLONASE) 50 MCG/ACT nasal  spray Place 2 sprays into both nostrils daily.     Marland Kitchen gabapentin (NEURONTIN) 300 MG capsule Take 300 mg daily by mouth.    . montelukast (SINGULAIR) 10 MG tablet Take 10 mg by mouth at bedtime.     Marland Kitchen omeprazole (PRILOSEC) 20 MG capsule Take 20 mg daily by mouth.    . predniSONE (DELTASONE) 1 MG tablet Take 1 mg by mouth daily.     . ranitidine (ZANTAC) 150 MG tablet Take 1 tablet by mouth daily.    Marland Kitchen rOPINIRole (REQUIP) 0.25 MG tablet Take 0.25 mg by mouth daily.     . traMADol (ULTRAM) 50 MG tablet Take 50 mg daily as needed by mouth.    . verapamil (CALAN-SR) 180 MG CR tablet Take 180 mg daily by mouth.     No current facility-administered medications for this visit.     Allergies:   Amoxicillin; Clarithromycin; Flagyl [metronidazole]; and Sulfamethoxazole-trimethoprim   Social History:  The patient  reports that she has never smoked. She has never used smokeless tobacco. She reports that she does not drink alcohol or use drugs.   Family History:  The patient's family history includes COPD in her mother; Heart attack in her mother; Heart failure in her father; Kidney failure in her father.    ROS:  Please see the history of present illness.   Otherwise, review of systems is positive for none.   All other systems are reviewed  and negative.   PHYSICAL EXAM: VS:  BP 117/78   Pulse 80   Ht 5' 5.5" (1.664 m)   Wt 177 lb (80.3 kg)   BMI 29.01 kg/m  , BMI Body mass index is 29.01 kg/m. GEN: Well nourished, well developed, in no acute distress  HEENT: normal  Neck: no JVD, carotid bruits, or masses Cardiac: RRR; no murmurs, rubs, or gallops,no edema  Respiratory:  clear to auscultation bilaterally, normal work of breathing GI: soft, nontender, nondistended, + BS MS: no deformity or atrophy  Skin: warm and dry, device site well healed Neuro:  Strength and sensation are intact Psych: euthymic mood, full affect  EKG:  EKG is not ordered today. Personal review of the ekg ordered  02/04/17 shows A paced, rate 71  Personal review of the device interrogation today. Results in Paceart    Recent Labs: No results found for requested labs within last 8760 hours.    Lipid Panel  No results found for: CHOL, TRIG, HDL, CHOLHDL, VLDL, LDLCALC, LDLDIRECT   Wt Readings from Last 3 Encounters:  09/11/17 177 lb (80.3 kg)  08/20/17 178 lb (80.7 kg)  06/05/17 181 lb (82.1 kg)      Other studies Reviewed: Additional studies/ records that were reviewed today include: Epic notes  ASSESSMENT AND PLAN:  1.  Sick sinus syndrome: Post Medtronic dual-chamber pacemaker.  She has had 36 mode switches all less than 1 minute at less than 0.1% of the time.  Device is functioning appropriately.  No changes today.  At this point, I do not think that her episodes of weakness and fatigue are due to rhythm related abnormalities.  2.  Hypertension: Controlled.  No changes at this time.  3.  Obstructive sleep apnea: Per primary physician.   Current medicines are reviewed at length with the patient today.   The patient does not have concerns regarding her medicines.  The following changes were made today: None  Labs/ tests ordered today include:  No orders of the defined types were placed in this encounter.    Disposition:   FU with Johany Hansman 12 months  Signed, Rhyli Depaula Denise LoaMartin Jashley Yellin, MD  09/11/2017 3:58 PM     [facility] [address] [place] [address] [phone] (office) [phone] (fax)

## 2017-09-13 ENCOUNTER — Telehealth: Payer: Self-pay | Admitting: *Deleted

## 2017-09-13 LAB — CUP PACEART INCLINIC DEVICE CHECK
Date Time Interrogation Session: 20190628113519
Implantable Lead Implant Date: 20131107
Implantable Lead Location: 753860
Implantable Lead Model: 4092
Implantable Lead Model: 5076
MDC IDC LEAD IMPLANT DT: 20131107
MDC IDC LEAD LOCATION: 753859
MDC IDC PG IMPLANT DT: 20131107

## 2017-09-13 NOTE — Telephone Encounter (Signed)
-----   Message from Baird LyonsSherri L Price, RN sent at 09/12/2017  9:13 AM EDT ----- Regarding: wants to do remotes Pt has PPM-MDT. She would like to start remote monitoring. Please arrange.  thx :)

## 2017-09-13 NOTE — Telephone Encounter (Signed)
Called to inform patient that a monitor was ordered for her on 09/09/17. Remote date given. Informed patient that she will have to send manual transmissions. Patient verbalized understanding.

## 2017-09-17 ENCOUNTER — Other Ambulatory Visit: Payer: Self-pay | Admitting: Cardiology

## 2017-09-30 DIAGNOSIS — M109 Gout, unspecified: Secondary | ICD-10-CM | POA: Insufficient documentation

## 2017-12-11 ENCOUNTER — Encounter: Payer: Medicare Other | Admitting: *Deleted

## 2017-12-12 DIAGNOSIS — Z862 Personal history of diseases of the blood and blood-forming organs and certain disorders involving the immune mechanism: Secondary | ICD-10-CM | POA: Diagnosis not present

## 2017-12-13 ENCOUNTER — Encounter: Payer: Self-pay | Admitting: Cardiology

## 2017-12-18 ENCOUNTER — Ambulatory Visit (INDEPENDENT_AMBULATORY_CARE_PROVIDER_SITE_OTHER): Payer: Medicare Other | Admitting: *Deleted

## 2017-12-18 DIAGNOSIS — I495 Sick sinus syndrome: Secondary | ICD-10-CM

## 2017-12-18 NOTE — Progress Notes (Signed)
Pt 2500 Medtronic home monitor successfully set up, transmission received next transmission scheduled for 03/20/2017 pt aware that if we do not receive the transmission this date shew will receive a call.

## 2018-01-09 DIAGNOSIS — R1032 Left lower quadrant pain: Secondary | ICD-10-CM | POA: Insufficient documentation

## 2018-01-09 HISTORY — DX: Left lower quadrant pain: R10.32

## 2018-02-25 ENCOUNTER — Ambulatory Visit (INDEPENDENT_AMBULATORY_CARE_PROVIDER_SITE_OTHER): Payer: Medicare Other | Admitting: Cardiology

## 2018-02-25 ENCOUNTER — Encounter: Payer: Self-pay | Admitting: Cardiology

## 2018-02-25 VITALS — BP 120/78 | HR 66 | Ht 65.5 in | Wt 177.0 lb

## 2018-02-25 DIAGNOSIS — I495 Sick sinus syndrome: Secondary | ICD-10-CM | POA: Diagnosis not present

## 2018-02-25 DIAGNOSIS — E7800 Pure hypercholesterolemia, unspecified: Secondary | ICD-10-CM | POA: Diagnosis not present

## 2018-02-25 DIAGNOSIS — I10 Essential (primary) hypertension: Secondary | ICD-10-CM | POA: Diagnosis not present

## 2018-02-25 DIAGNOSIS — Z950 Presence of cardiac pacemaker: Secondary | ICD-10-CM | POA: Diagnosis not present

## 2018-02-25 NOTE — Progress Notes (Signed)
Cardiology Office Note:    Date:  02/25/2018   ID:  Denise Francis, DOB [birth year], MRN [id number]  PCP:  Olive Bass, MD  Cardiologist:  Garwin Brothers, MD   Referring MD: Olive Bass, MD    ASSESSMENT:    1. Essential hypertension   2. Hypercholesterolemia   3. Sick sinus syndrome (HCC)   4. Presence of permanent cardiac pacemaker    PLAN:    In order of problems listed above:  1. Primary prevention stressed with the patient.  Importance of compliance with diet and medication stressed and she vocalized understanding.  Her blood pressure is stable. 2. Diet was discussed for dyslipidemia.  Lipids are followed by primary care physician.  Recent pacemaker evaluation was unremarkable. 3. Patient will be seen in follow-up appointment in 9 months or earlier if the patient has any concerns    Medication Adjustments/Labs and Tests Ordered: Current medicines are reviewed at length with the patient today.  Concerns regarding medicines are outlined above.  No orders of the defined types were placed in this encounter.  No orders of the defined types were placed in this encounter.    Chief Complaint  Patient presents with  . Follow-up     History of Present Illness:    Denise Francis is a 82 y.o. female.  Patient has past medical history of essential hypertension, dyslipidemia and permanent pacemaker for sick sinus syndrome.  Patient has done well and is a very active lady.  She is here for routine follow-up.  Her pacemaker is followed by electrophysiology colleagues.  She denies any chest pain orthopnea or PND.  She is taking care of activities of daily living.  At the time of my evaluation, the patient is alert awake oriented and in no distress.  Past Medical History:  Diagnosis Date  . Hyperlipidemia   . Hypertension     History reviewed. No pertinent surgical history.  Current Medications: Current Meds  Medication Sig  . aspirin EC 81 MG tablet Take  81 mg by mouth daily.   Marland Kitchen atorvastatin (LIPITOR) 10 MG tablet TAKE 1 TABLET DAILY  . BENICAR 40 MG tablet TAKE 1 TABLET DAILY  . calcitRIOL (ROCALTROL) 0.25 MCG capsule Take 0.25 mcg daily by mouth.  . cetirizine (ZYRTEC) 10 MG tablet Take 10 mg by mouth daily.   . diazepam (VALIUM) 2 MG tablet Take 2 mg by mouth every 6 (six) hours as needed for anxiety.  . fluticasone (FLONASE) 50 MCG/ACT nasal spray Place 2 sprays into both nostrils daily.   Marland Kitchen gabapentin (NEURONTIN) 300 MG capsule Take 300 mg daily by mouth.  . montelukast (SINGULAIR) 10 MG tablet Take 10 mg by mouth at bedtime.   Marland Kitchen omeprazole (PRILOSEC) 20 MG capsule Take 20 mg daily by mouth.  . predniSONE (DELTASONE) 1 MG tablet Take 2 mg by mouth daily.   . ranitidine (ZANTAC) 150 MG tablet Take 1 tablet by mouth daily.  Marland Kitchen rOPINIRole (REQUIP) 0.25 MG tablet Take 0.25 mg by mouth daily.   . traMADol (ULTRAM) 50 MG tablet Take 50 mg daily as needed by mouth.  . verapamil (CALAN-SR) 180 MG CR tablet Take 180 mg daily by mouth.     Allergies:   Amoxicillin; Clarithromycin; Flagyl [metronidazole]; and Sulfamethoxazole-trimethoprim   Social History   Socioeconomic History  . Marital status: Widowed    Spouse name: Not on file  . Number of children: Not on file  . Years of education: Not on  file  . Highest education level: Not on file  Occupational History  . Not on file  Social Needs  . Financial resource strain: Not on file  . Food insecurity:    Worry: Not on file    Inability: Not on file  . Transportation needs:    Medical: Not on file    Non-medical: Not on file  Tobacco Use  . Smoking status: Never Smoker  . Smokeless tobacco: Never Used  Substance and Sexual Activity  . Alcohol use: No    Alcohol/week: 0.0 standard drinks    Frequency: Never  . Drug use: No  . Sexual activity: Not on file  Lifestyle  . Physical activity:    Days per week: Not on file    Minutes per session: Not on file  . Stress: Not on file    Relationships  . Social connections:    Talks on phone: Not on file    Gets together: Not on file    Attends religious service: Not on file    Active member of club or organization: Not on file    Attends meetings of clubs or organizations: Not on file    Relationship status: Not on file  Other Topics Concern  . Not on file  Social History Narrative  . Not on file     Family History: The patient's family history includes COPD in her mother; Heart attack in her mother; Heart failure in her father; Kidney failure in her father.  ROS:   Please see the history of present illness.    All other systems reviewed and are negative.  EKGs/Labs/Other Studies Reviewed:    The following studies were reviewed today: I discussed my findings with the patient at extensive length.   Recent Labs: No results found for requested labs within last 8760 hours.  Recent Lipid Panel No results found for: CHOL, TRIG, HDL, CHOLHDL, VLDL, LDLCALC, LDLDIRECT  Physical Exam:    VS:  BP 120/78 (BP Location: Left Arm, Patient Position: Sitting, Cuff Size: Normal)   Pulse 66   Ht 5' 5.5" (1.664 m)   Wt 177 lb (80.3 kg)   SpO2 98%   BMI 29.01 kg/m     Wt Readings from Last 3 Encounters:  02/25/18 177 lb (80.3 kg)  09/11/17 177 lb (80.3 kg)  08/20/17 178 lb (80.7 kg)     GEN: Patient is in no acute distress HEENT: Normal NECK: No JVD; No carotid bruits LYMPHATICS: No lymphadenopathy CARDIAC: Hear sounds regular, 2/6 systolic murmur at the apex. RESPIRATORY:  Clear to auscultation without rales, wheezing or rhonchi  ABDOMEN: Soft, non-tender, non-distended MUSCULOSKELETAL:  No edema; No deformity  SKIN: Warm and dry NEUROLOGIC:  Alert and oriented x 3 PSYCHIATRIC:  Normal affect   Signed, Garwin Brothersajan R Revankar, MD  02/25/2018 3:05 PM    [facility]

## 2018-02-25 NOTE — Patient Instructions (Signed)
Medication Instructions: Your physician recommends that you continue on your current medications as directed. Please refer to the Current Medication list given to you today.    Lab work: None  If you have labs (blood work) drawn today and your tests are completely normal, you will receive your results only by: Marland Kitchen. MyChart Message (if you have MyChart) OR . A paper copy in the mail If you have any lab test that is abnormal or we need to change your treatment, we will call you to review the results.  Testing/Procedures: None  Follow-Up: At [facility], you and your health needs are our priority.  As part of our continuing mission to provide you with exceptional heart care, we have created designated Provider Care Teams.  These Care Teams include your primary Cardiologist (physician) and Advanced Practice Providers (APPs -  Physician Assistants and Nurse Practitioners) who all work together to provide you with the care you need, when you need it. You will need a follow up appointment in 9 months.  Please call our office 2 months in advance to schedule this appointment.  Any Other Special Instructions Will Be Listed Below (If Applicable).

## 2018-03-03 DIAGNOSIS — Z131 Encounter for screening for diabetes mellitus: Secondary | ICD-10-CM | POA: Insufficient documentation

## 2018-03-03 HISTORY — DX: Encounter for screening for diabetes mellitus: Z13.1

## 2018-03-20 ENCOUNTER — Ambulatory Visit (INDEPENDENT_AMBULATORY_CARE_PROVIDER_SITE_OTHER): Payer: Medicare Other

## 2018-03-20 DIAGNOSIS — I495 Sick sinus syndrome: Secondary | ICD-10-CM | POA: Diagnosis not present

## 2018-03-20 LAB — CUP PACEART REMOTE DEVICE CHECK
Battery Impedance: 473 Ohm
Brady Statistic AS VS Percent: 16 %
Date Time Interrogation Session: 20200102204708
Implantable Lead Implant Date: 20131107
Implantable Lead Location: 753860
Implantable Lead Model: 4092
Lead Channel Impedance Value: 620 Ohm
Lead Channel Pacing Threshold Pulse Width: 0.4 ms
Lead Channel Setting Pacing Amplitude: 2 V
Lead Channel Setting Pacing Pulse Width: 0.4 ms
Lead Channel Setting Sensing Sensitivity: 1.4 mV
MDC IDC LEAD IMPLANT DT: 20131107
MDC IDC LEAD LOCATION: 753859
MDC IDC MSMT BATTERY REMAINING LONGEVITY: 97 mo
MDC IDC MSMT BATTERY VOLTAGE: 2.78 V
MDC IDC MSMT LEADCHNL RA IMPEDANCE VALUE: 444 Ohm
MDC IDC MSMT LEADCHNL RA PACING THRESHOLD AMPLITUDE: 0.5 V
MDC IDC MSMT LEADCHNL RA PACING THRESHOLD PULSEWIDTH: 0.4 ms
MDC IDC MSMT LEADCHNL RV PACING THRESHOLD AMPLITUDE: 0.375 V
MDC IDC PG IMPLANT DT: 20131107
MDC IDC SET LEADCHNL RA PACING AMPLITUDE: 1.5 V
MDC IDC STAT BRADY AP VP PERCENT: 4 %
MDC IDC STAT BRADY AP VS PERCENT: 79 %
MDC IDC STAT BRADY AS VP PERCENT: 0 %

## 2018-03-21 NOTE — Progress Notes (Signed)
Remote pacemaker transmission.   

## 2018-05-14 DIAGNOSIS — K909 Intestinal malabsorption, unspecified: Secondary | ICD-10-CM

## 2018-05-14 HISTORY — DX: Intestinal malabsorption, unspecified: K90.9

## 2018-06-19 ENCOUNTER — Encounter: Payer: Medicare Other | Admitting: *Deleted

## 2018-06-19 ENCOUNTER — Other Ambulatory Visit: Payer: Self-pay

## 2018-06-20 ENCOUNTER — Telehealth: Payer: Self-pay

## 2018-06-20 NOTE — Telephone Encounter (Signed)
Spoke with patient to remind of missed remote transmission 

## 2018-06-27 ENCOUNTER — Encounter: Payer: Self-pay | Admitting: Cardiology

## 2018-07-09 ENCOUNTER — Ambulatory Visit (INDEPENDENT_AMBULATORY_CARE_PROVIDER_SITE_OTHER): Payer: Medicare Other | Admitting: *Deleted

## 2018-07-09 ENCOUNTER — Other Ambulatory Visit: Payer: Self-pay

## 2018-07-09 DIAGNOSIS — I495 Sick sinus syndrome: Secondary | ICD-10-CM

## 2018-07-10 LAB — CUP PACEART REMOTE DEVICE CHECK
Battery Impedance: 498 Ohm
Battery Remaining Longevity: 94 mo
Battery Voltage: 2.78 V
Brady Statistic AP VP Percent: 4 %
Brady Statistic AP VS Percent: 80 %
Brady Statistic AS VP Percent: 0 %
Brady Statistic AS VS Percent: 15 %
Date Time Interrogation Session: 20200422153707
Implantable Lead Implant Date: 20131107
Implantable Lead Implant Date: 20131107
Implantable Lead Location: 753859
Implantable Lead Location: 753860
Implantable Lead Model: 4092
Implantable Lead Model: 5076
Implantable Pulse Generator Implant Date: 20131107
Lead Channel Impedance Value: 450 Ohm
Lead Channel Impedance Value: 629 Ohm
Lead Channel Pacing Threshold Amplitude: 0.5 V
Lead Channel Pacing Threshold Amplitude: 0.5 V
Lead Channel Pacing Threshold Pulse Width: 0.4 ms
Lead Channel Pacing Threshold Pulse Width: 0.4 ms
Lead Channel Setting Pacing Amplitude: 1.5 V
Lead Channel Setting Pacing Amplitude: 2 V
Lead Channel Setting Pacing Pulse Width: 0.4 ms
Lead Channel Setting Sensing Sensitivity: 1.4 mV

## 2018-07-17 ENCOUNTER — Telehealth (INDEPENDENT_AMBULATORY_CARE_PROVIDER_SITE_OTHER): Payer: Medicare Other | Admitting: Gastroenterology

## 2018-07-17 ENCOUNTER — Other Ambulatory Visit: Payer: Self-pay

## 2018-07-17 DIAGNOSIS — K219 Gastro-esophageal reflux disease without esophagitis: Secondary | ICD-10-CM

## 2018-07-17 NOTE — Progress Notes (Signed)
Remote pacemaker transmission.   

## 2018-07-17 NOTE — Patient Instructions (Signed)
Continue omeprazole 20 mg p.o. QAM, Zantac 150mg  po QHS. -Nonpharmacologic means of reflux control. -She is to call us if she starts having any GI problems.  -I have recommended her to call us at least 30 days before she runs out of prescriptions.  So that we can call in 90-day supply.

## 2018-07-17 NOTE — Progress Notes (Signed)
Chief Complaint: FU  Referring Provider:  Olive Bass, MD      ASSESSMENT AND PLAN;   #1. GERD with HH.  #2. Comorbid conditions; CKD3 (Cr 1.29 01/2028), PMR, gout, HTN, HLD.  Plan: -Continue omeprazole 20 mg p.o. QAM, Zantac 150mg  po QHS. -Nonpharmacologic means of reflux control. -She is to call us if she starts having any GI problems.  -I have recommended her to call us at least 30 days before she runs out of prescriptions.  So that we can call in 90-day supply.   HPI:    Denise Francis is a 83 y.o. female  " Feels normal now".  No GI complaints.  In January/feb 2020-she could not get Zantac (due to contamination).  Omeprazole was increased to 20 mg p.o. twice daily.  She started having more problems including diarrhea, crampy abdominal pain.  She was seen by Dr. Sol Passer. His office has been able to call in 53-month supply for Zantac which [name] got 2 weeks ago.  She has cut down on omeprazole and is currently taking it once a day in the morning and Zantac 150 milligrams p.o. nightly with near complete resolution of previous symptoms.  Currently, No nausea, vomiting, heartburn, regurgitation, odynophagia or dysphagia.  No significant diarrhea or constipation.  There is no melena or hematochezia. No unintentional weight loss.  No abdominal pain.  Does not want to go off Zantac.  Willing to take a chance.  Past GI procedures: -EGD 12/2010-small hiatal hernia, mild gastritis.  Negative small bowel biopsies for celiac, negative CLO. -Attempted colonoscopy 07/2010 stopped due to poor preparation.  She refuses to have any further colonoscopies.  Has been offered multiple times.  Past Medical History:  Diagnosis Date  . Arthropathy    nos, multiple sites with polymyalgia rheumatica  . Asthma   . CRI (chronic renal insufficiency), stage 3 (moderate) (HCC)   . Diverticulitis of colon with hemorrhage   . GERD (gastroesophageal reflux disease)   . Gout   . H/O iron  deficiency anemia   . Hyperlipidemia   . Hypertension     Past Surgical History:  Procedure Laterality Date  . BACK SURGERY     x2  . COLONOSCOPY  08/16/2010   Attempted. Moderate-to-severe sigmoid diverticulosis. Otherwise grossly normal attempted colonoscopy up to 40 cm (The patient had very poor preparation)  . ESOPHAGOGASTRODUODENOSCOPY  12/20/2010   Small hiatal hernia. Mild gastritis  . KNEE SURGERY    . WRIST SURGERY      Family History  Problem Relation Age of Onset  . Heart attack Mother   . COPD Mother   . Kidney failure Father   . Heart failure Father   . Colon cancer Neg Hx   . Esophageal cancer Neg Hx     Social History   Tobacco Use  . Smoking status: Never Smoker  . Smokeless tobacco: Never Used  Substance Use Topics  . Alcohol use: Yes    Frequency: Never    Comment: rare  . Drug use: No    Current Outpatient Medications  Medication Sig Dispense Refill  . aspirin EC 81 MG tablet Take 81 mg by mouth daily.     Marland Kitchen atorvastatin (LIPITOR) 10 MG tablet TAKE 1 TABLET DAILY (Patient taking differently: 20 mg at bedtime. ) 90 tablet 1  . BENICAR 40 MG tablet TAKE 1 TABLET DAILY (Patient taking differently: 40 mg daily. 0.5 tablet in the morning and 0.5 in the evening) 90 tablet  1  . calcitRIOL (ROCALTROL) 0.25 MCG capsule Take 0.25 mcg daily by mouth.    . cetirizine (ZYRTEC) 10 MG tablet Take 10 mg by mouth daily.     . fluticasone (FLONASE) 50 MCG/ACT nasal spray Place 2 sprays into both nostrils daily.     Marland Kitchen. gabapentin (NEURONTIN) 300 MG capsule Take 300 mg daily by mouth.    . montelukast (SINGULAIR) 10 MG tablet Take 10 mg by mouth at bedtime.     Marland Kitchen. omeprazole (PRILOSEC) 20 MG capsule Take 20 mg daily by mouth.    . predniSONE (DELTASONE) 1 MG tablet Take 1 mg by mouth daily.     . ranitidine (ZANTAC) 150 MG tablet Take 1 tablet by mouth at bedtime.     Marland Kitchen. rOPINIRole (REQUIP) 0.25 MG tablet Take 0.25 mg by mouth 2 (two) times daily.     . traMADol  (ULTRAM) 50 MG tablet Take 50 mg daily as needed by mouth.    . verapamil (CALAN-SR) 180 MG CR tablet Take 180 mg daily by mouth.    . diazepam (VALIUM) 2 MG tablet Take 2 mg by mouth as needed for anxiety.      No current facility-administered medications for this visit.     Allergies  Allergen Reactions  . Amoxicillin Nausea And Vomiting  . Clarithromycin Nausea And Vomiting  . Flagyl [Metronidazole] Nausea And Vomiting  . Sulfamethoxazole-Trimethoprim Other (See Comments)    Renal insufficiency    Review of Systems:  Neg except for polymyalgia, joint pains     Physical Exam:    There were no vitals taken for this visit. There were no vitals filed for this visit. Not examined since it was a tele-visit  Data Reviewed: I have personally reviewed following labs and imaging studies  Labs reviewed-from 02/04/2018 normal CMP except creatinine 1.29, GFR 39 mL/min, normal CBC with hemoglobin 13.2, MCV 96.  Sed rate of 15.  This service was provided via telemedicine.  The patient was located at home.  The provider was located in office.  The patient did consent to this telephone visit and is aware of possible charges through their insurance for this visit.  The patient was referred by Dr. Sol Passerough.    Time spent on call: 15 min   Edman Circleaj Sterling Mondo, MD 07/17/2018, 11:57 AM  Cc: Olive Bassough, Robert L, MD

## 2018-07-21 ENCOUNTER — Other Ambulatory Visit: Payer: Self-pay | Admitting: Cardiology

## 2018-07-21 NOTE — Telephone Encounter (Signed)
Rx refill sent to pharmacy. 

## 2018-07-27 DIAGNOSIS — K219 Gastro-esophageal reflux disease without esophagitis: Secondary | ICD-10-CM

## 2018-07-27 DIAGNOSIS — J9601 Acute respiratory failure with hypoxia: Secondary | ICD-10-CM

## 2018-07-27 DIAGNOSIS — E785 Hyperlipidemia, unspecified: Secondary | ICD-10-CM

## 2018-07-27 DIAGNOSIS — I361 Nonrheumatic tricuspid (valve) insufficiency: Secondary | ICD-10-CM

## 2018-07-27 DIAGNOSIS — M1990 Unspecified osteoarthritis, unspecified site: Secondary | ICD-10-CM

## 2018-07-27 DIAGNOSIS — I10 Essential (primary) hypertension: Secondary | ICD-10-CM

## 2018-07-27 DIAGNOSIS — E860 Dehydration: Secondary | ICD-10-CM

## 2018-07-27 DIAGNOSIS — R0902 Hypoxemia: Secondary | ICD-10-CM | POA: Diagnosis not present

## 2018-07-27 DIAGNOSIS — I6350 Cerebral infarction due to unspecified occlusion or stenosis of unspecified cerebral artery: Secondary | ICD-10-CM | POA: Diagnosis not present

## 2018-07-27 DIAGNOSIS — I272 Pulmonary hypertension, unspecified: Secondary | ICD-10-CM | POA: Diagnosis not present

## 2018-07-27 DIAGNOSIS — J45909 Unspecified asthma, uncomplicated: Secondary | ICD-10-CM

## 2018-07-28 DIAGNOSIS — E860 Dehydration: Secondary | ICD-10-CM | POA: Diagnosis not present

## 2018-07-28 DIAGNOSIS — R0902 Hypoxemia: Secondary | ICD-10-CM | POA: Diagnosis not present

## 2018-07-28 DIAGNOSIS — E785 Hyperlipidemia, unspecified: Secondary | ICD-10-CM | POA: Diagnosis not present

## 2018-07-28 DIAGNOSIS — J9601 Acute respiratory failure with hypoxia: Secondary | ICD-10-CM | POA: Diagnosis not present

## 2018-07-29 DIAGNOSIS — E785 Hyperlipidemia, unspecified: Secondary | ICD-10-CM | POA: Diagnosis not present

## 2018-07-29 DIAGNOSIS — E860 Dehydration: Secondary | ICD-10-CM | POA: Diagnosis not present

## 2018-07-29 DIAGNOSIS — R0902 Hypoxemia: Secondary | ICD-10-CM | POA: Diagnosis not present

## 2018-07-29 DIAGNOSIS — J9601 Acute respiratory failure with hypoxia: Secondary | ICD-10-CM | POA: Diagnosis not present

## 2018-07-30 DIAGNOSIS — J9601 Acute respiratory failure with hypoxia: Secondary | ICD-10-CM | POA: Diagnosis not present

## 2018-07-30 DIAGNOSIS — R0902 Hypoxemia: Secondary | ICD-10-CM | POA: Diagnosis not present

## 2018-07-30 DIAGNOSIS — E860 Dehydration: Secondary | ICD-10-CM | POA: Diagnosis not present

## 2018-07-30 DIAGNOSIS — E785 Hyperlipidemia, unspecified: Secondary | ICD-10-CM | POA: Diagnosis not present

## 2018-07-31 DIAGNOSIS — J9601 Acute respiratory failure with hypoxia: Secondary | ICD-10-CM | POA: Diagnosis not present

## 2018-07-31 DIAGNOSIS — R0902 Hypoxemia: Secondary | ICD-10-CM | POA: Diagnosis not present

## 2018-07-31 DIAGNOSIS — E860 Dehydration: Secondary | ICD-10-CM | POA: Diagnosis not present

## 2018-07-31 DIAGNOSIS — E785 Hyperlipidemia, unspecified: Secondary | ICD-10-CM | POA: Diagnosis not present

## 2018-08-01 DIAGNOSIS — R0902 Hypoxemia: Secondary | ICD-10-CM | POA: Diagnosis not present

## 2018-08-01 DIAGNOSIS — E785 Hyperlipidemia, unspecified: Secondary | ICD-10-CM | POA: Diagnosis not present

## 2018-08-01 DIAGNOSIS — E860 Dehydration: Secondary | ICD-10-CM | POA: Insufficient documentation

## 2018-08-01 DIAGNOSIS — R471 Dysarthria and anarthria: Secondary | ICD-10-CM

## 2018-08-01 DIAGNOSIS — M1990 Unspecified osteoarthritis, unspecified site: Secondary | ICD-10-CM

## 2018-08-01 DIAGNOSIS — J9601 Acute respiratory failure with hypoxia: Secondary | ICD-10-CM | POA: Diagnosis not present

## 2018-08-01 DIAGNOSIS — I6350 Cerebral infarction due to unspecified occlusion or stenosis of unspecified cerebral artery: Secondary | ICD-10-CM | POA: Insufficient documentation

## 2018-08-01 DIAGNOSIS — Z789 Other specified health status: Secondary | ICD-10-CM | POA: Insufficient documentation

## 2018-08-01 DIAGNOSIS — Z8709 Personal history of other diseases of the respiratory system: Secondary | ICD-10-CM

## 2018-08-01 DIAGNOSIS — Z7409 Other reduced mobility: Secondary | ICD-10-CM | POA: Insufficient documentation

## 2018-08-01 HISTORY — DX: Cerebral infarction due to unspecified occlusion or stenosis of unspecified cerebral artery: I63.50

## 2018-08-01 HISTORY — DX: Unspecified osteoarthritis, unspecified site: M19.90

## 2018-08-01 HISTORY — DX: Other reduced mobility: Z78.9

## 2018-08-01 HISTORY — DX: Dysarthria and anarthria: R47.1

## 2018-08-01 HISTORY — DX: Personal history of other diseases of the respiratory system: Z87.09

## 2018-08-01 HISTORY — DX: Dehydration: E86.0

## 2018-08-25 DIAGNOSIS — G8191 Hemiplegia, unspecified affecting right dominant side: Secondary | ICD-10-CM | POA: Insufficient documentation

## 2018-08-25 DIAGNOSIS — I69398 Other sequelae of cerebral infarction: Secondary | ICD-10-CM | POA: Insufficient documentation

## 2018-08-25 DIAGNOSIS — R209 Unspecified disturbances of skin sensation: Secondary | ICD-10-CM | POA: Insufficient documentation

## 2018-08-25 HISTORY — DX: Unspecified disturbances of skin sensation: R20.9

## 2018-08-25 HISTORY — DX: Hemiplegia, unspecified affecting right dominant side: G81.91

## 2018-08-25 HISTORY — DX: Other sequelae of cerebral infarction: I69.398

## 2018-08-28 ENCOUNTER — Telehealth: Payer: Medicare Other | Admitting: Cardiology

## 2018-08-29 ENCOUNTER — Telehealth: Payer: Self-pay

## 2018-08-29 ENCOUNTER — Encounter: Payer: Self-pay | Admitting: Cardiology

## 2018-08-29 ENCOUNTER — Other Ambulatory Visit: Payer: Self-pay

## 2018-08-29 ENCOUNTER — Telehealth (INDEPENDENT_AMBULATORY_CARE_PROVIDER_SITE_OTHER): Payer: Medicare Other | Admitting: Cardiology

## 2018-08-29 VITALS — BP 122/72 | Ht 65.5 in | Wt 177.0 lb

## 2018-08-29 DIAGNOSIS — I495 Sick sinus syndrome: Secondary | ICD-10-CM | POA: Diagnosis not present

## 2018-08-29 DIAGNOSIS — Z950 Presence of cardiac pacemaker: Secondary | ICD-10-CM | POA: Diagnosis not present

## 2018-08-29 DIAGNOSIS — G4733 Obstructive sleep apnea (adult) (pediatric): Secondary | ICD-10-CM | POA: Diagnosis not present

## 2018-08-29 DIAGNOSIS — Z9989 Dependence on other enabling machines and devices: Secondary | ICD-10-CM

## 2018-08-29 DIAGNOSIS — I10 Essential (primary) hypertension: Secondary | ICD-10-CM | POA: Diagnosis not present

## 2018-08-29 DIAGNOSIS — Z8673 Personal history of transient ischemic attack (TIA), and cerebral infarction without residual deficits: Secondary | ICD-10-CM

## 2018-08-29 MED ORDER — LOSARTAN POTASSIUM 100 MG PO TABS: 50.0000 mg | ORAL_TABLET | Freq: Every day | ORAL | 0 refills | Status: DC

## 2018-08-29 NOTE — Progress Notes (Signed)
Cardiology Office Note:    Date:  08/29/2018   ID:  Denise RogersSybil B Karas, DOB [birth year], MRN [id number]  PCP:  Olive Bassough, Robert L, MD  Cardiologist:  Garwin Brothersajan R , MD   Referring MD: Olive Bassough, Robert L, MD    ASSESSMENT:    1. Presence of permanent cardiac pacemaker   2. Sick sinus syndrome (HCC)   3. Essential hypertension   4. OSA on CPAP   5. History of recent stroke    PLAN:    In order of problems listed above:  1. History of stroke: Patient has been extensively evaluated.  Patient is taking dual antiplatelet therapy as recommended by neurology.  Patient is also on statin therapy and lipids will be followed by primary care physician.  We will do a pacemaker check to evaluate if she has any arrhythmias such as atrial fibrillation.  I discussed this with her and her daughter at length extensively and questions were answered to her satisfaction. 2. Essential hypertension: Blood pressure is borderline and new in view of her advanced age and recent stroke I would like to see her blood pressure little higher.  She and her daughter also mentioned that her blood pressure runs on the lower side.  I have asked her to cut down losartan to half dose which is 50 mg daily.  They will keep a track of her pulse and blood pressures and send it to me in the mail in 1 to 2 weeks. 3. Mixed dyslipidemia: Diet was discussed.  She is on statin therapy.  Lipids are followed by her primary care physician. 4. Her antiplatelet therapy will be guided by her neurologist. 5. Patient will be seen in follow-up appointment in 3 months or earlier if the patient has any concerns.  Patient and daughter had multiple questions which were answered to their satisfaction.  Total time for this delivery evaluation and record review was 35 minutes    Medication Adjustments/Labs and Tests Ordered: Current medicines are reviewed at length with the patient today.  Concerns regarding medicines are outlined above.  No orders of  the defined types were placed in this encounter.  No orders of the defined types were placed in this encounter.    No chief complaint on file.    History of Present Illness:    Denise Francis is a 83 y.o. female.  She has past medical history of sick sinus syndrome and pacemaker.  She has history of essential hypertension and dyslipidemia.  The patient recently was admitted to the hospital with a stroke and is now recovering and getting rehab therapy.  Patient's daughter accompanies her for this visit.  No chest pain orthopnea or PND.  At the time of my evaluation, the patient is alert awake oriented and in no distress.  Past Medical History:  Diagnosis Date  . Arthropathy    nos, multiple sites with polymyalgia rheumatica  . Asthma   . CRI (chronic renal insufficiency), stage 3 (moderate) (HCC)   . Diverticulitis of colon with hemorrhage   . GERD (gastroesophageal reflux disease)   . Gout   . H/O iron deficiency anemia   . Hyperlipidemia   . Hypertension     Past Surgical History:  Procedure Laterality Date  . BACK SURGERY     x2  . COLONOSCOPY  08/16/2010   Attempted. Moderate-to-severe sigmoid diverticulosis. Otherwise grossly normal attempted colonoscopy up to 40 cm (The patient had very poor preparation)  . ESOPHAGOGASTRODUODENOSCOPY  12/20/2010   Small hiatal  hernia. Mild gastritis  . KNEE SURGERY    . WRIST SURGERY      Current Medications: Current Meds  Medication Sig  . atorvastatin (LIPITOR) 10 MG tablet TAKE 1 TABLET DAILY (Patient taking differently: 20 mg at bedtime. )  . calcitRIOL (ROCALTROL) 0.25 MCG capsule Take 0.25 mcg daily by mouth.  . cetirizine (ZYRTEC) 10 MG tablet Take 10 mg by mouth daily.   . clopidogrel (PLAVIX) 75 MG tablet Take 1 tablet by mouth daily.  . diazepam (VALIUM) 2 MG tablet Take 2 mg by mouth as needed for anxiety.   Marland Kitchen FLUoxetine (PROZAC) 20 MG capsule Take 1 capsule by mouth daily.  . fluticasone (FLONASE) 50 MCG/ACT nasal  spray Place 2 sprays into both nostrils daily.   . furosemide (LASIX) 20 MG tablet Take 1 tablet by mouth daily.  Marland Kitchen gabapentin (NEURONTIN) 300 MG capsule Take 300 mg daily by mouth.  . losartan (COZAAR) 100 MG tablet Take 1 tablet by mouth daily.  . montelukast (SINGULAIR) 10 MG tablet Take 10 mg by mouth at bedtime.   Marland Kitchen omeprazole (PRILOSEC) 20 MG capsule Take 20 mg daily by mouth.  . ranitidine (ZANTAC) 150 MG tablet Take 1 tablet by mouth at bedtime.   Marland Kitchen rOPINIRole (REQUIP) 0.25 MG tablet Take 0.25 mg by mouth 2 (two) times daily.   . traMADol (ULTRAM) 50 MG tablet Take 50 mg daily as needed by mouth.  . verapamil (CALAN-SR) 180 MG CR tablet Take 180 mg daily by mouth.     Allergies:   Amoxicillin, Clarithromycin, Flagyl [metronidazole], and Sulfamethoxazole-trimethoprim   Social History   Socioeconomic History  . Marital status: Widowed    Spouse name: Not on file  . Number of children: Not on file  . Years of education: Not on file  . Highest education level: Not on file  Occupational History  . Not on file  Social Needs  . Financial resource strain: Not on file  . Food insecurity    Worry: Not on file    Inability: Not on file  . Transportation needs    Medical: Not on file    Non-medical: Not on file  Tobacco Use  . Smoking status: Never Smoker  . Smokeless tobacco: Never Used  Substance and Sexual Activity  . Alcohol use: Yes    Frequency: Never    Comment: rare  . Drug use: No  . Sexual activity: Not on file  Lifestyle  . Physical activity    Days per week: Not on file    Minutes per session: Not on file  . Stress: Not on file  Relationships  . Social Herbalist on phone: Not on file    Gets together: Not on file    Attends religious service: Not on file    Active member of club or organization: Not on file    Attends meetings of clubs or organizations: Not on file    Relationship status: Not on file  Other Topics Concern  . Not on file   Social History Narrative  . Not on file     Family History: The patient's family history includes COPD in her mother; Heart attack in her mother; Heart failure in her father; Kidney failure in her father. There is no history of Colon cancer or Esophageal cancer.  ROS:   Please see the history of present illness.    All other systems reviewed and are negative.  EKGs/Labs/Other Studies Reviewed:  The following studies were reviewed today: I reviewed hospital records extensively with the patient.   Recent Labs: No results found for requested labs within last 8760 hours.  Recent Lipid Panel No results found for: CHOL, TRIG, HDL, CHOLHDL, VLDL, LDLCALC, LDLDIRECT  Physical Exam:    VS:  Ht 5' 5.5" (1.664 m)   Wt 177 lb (80.3 kg)   SpO2 95%   BMI 29.01 kg/m     Wt Readings from Last 3 Encounters:  08/29/18 177 lb (80.3 kg)  02/25/18 177 lb (80.3 kg)  09/11/17 177 lb (80.3 kg)     GEN: Patient is in no acute distress HEENT: Normal NECK: No JVD; No carotid bruits LYMPHATICS: No lymphadenopathy CARDIAC: Hear sounds regular, 2/6 systolic murmur at the apex. RESPIRATORY:  Clear to auscultation without rales, wheezing or rhonchi  ABDOMEN: Soft, non-tender, non-distended MUSCULOSKELETAL:  No edema; No deformity  SKIN: Warm and dry NEUROLOGIC:  Alert and oriented x 3 PSYCHIATRIC:  Normal affect   Signed, Garwin Brothersajan R , MD  08/29/2018 1:58 PM    [facility]

## 2018-08-29 NOTE — Telephone Encounter (Signed)
Transmission reviewed. Normal device function. AHR episodes consistent with atrial tachycardia, all episodes <10 minutes. No true AF seen.  Chanetta Marshall, NP 08/29/2018 4:08 PM

## 2018-08-29 NOTE — Telephone Encounter (Signed)
I help the pt send a manual transmission with her home monitor. Transmission received 08-29-2018 at 3:19 pm. Dr. Geraldo Pitter needs the results to the transmission.

## 2018-08-29 NOTE — Telephone Encounter (Signed)
Please inform patient that she had some simple arrhythmias.  No atrial fibrillation

## 2018-08-29 NOTE — Patient Instructions (Signed)
Medication Instructions:  Your physician has recommended you make the following change in your medication:  DECREASE losartan to 50 mg (0.5 tablet) once a day  If you need a refill on your cardiac medications before your next appointment, please call your pharmacy.   Lab work: NONE If you have labs (blood work) drawn today and your tests are completely normal, you will receive your results only by: Marland Kitchen MyChart Message (if you have MyChart) OR . A paper copy in the mail If you have any lab test that is abnormal or we need to change your treatment, we will call you to review the results.  Testing/Procedures: Your physician has requested that you regularly monitor and record your blood pressure readings at home. Please use the same machine at the same time of day to check your readings and record them to bring to your follow-up visit.   Blood Pressure Record Sheet To take your blood pressure, you will need a blood pressure machine. You can buy a blood pressure machine (blood pressure monitor) at your clinic, drug store, or online. When choosing one, consider:  An automatic monitor that has an arm cuff.  A cuff that wraps snugly around your upper arm. You should be able to fit only one finger between your arm and the cuff.  A device that stores blood pressure reading results.  Do not choose a monitor that measures your blood pressure from your wrist or finger. Follow your health care provider's instructions for how to take your blood pressure. To use this form:  Get one reading in the morning (a.m.) before you take any medicines.  Get one reading in the evening (p.m.) before supper.  Take at least 2 readings with each blood pressure check. This makes sure the results are correct. Wait 1-2 minutes between measurements.  Write down the results in the spaces on this form.  Repeat this once a week, or as told by your health care provider.  Make a follow-up appointment with your health  care provider to discuss the results. Blood pressure log Date: _______________________  a.m. _____________________(1st reading) _____________________(2nd reading)  p.m. _____________________(1st reading) _____________________(2nd reading) Date: _______________________  a.m. _____________________(1st reading) _____________________(2nd reading)  p.m. _____________________(1st reading) _____________________(2nd reading) Date: _______________________  a.m. _____________________(1st reading) _____________________(2nd reading)  p.m. _____________________(1st reading) _____________________(2nd reading) Date: _______________________  a.m. _____________________(1st reading) _____________________(2nd reading)  p.m. _____________________(1st reading) _____________________(2nd reading) Date: _______________________  a.m. _____________________(1st reading) _____________________(2nd reading)  p.m. _____________________(1st reading) _____________________(2nd reading) This information is not intended to replace advice given to you by your health care provider. Make sure you discuss any questions you have with your health care provider. Document Released: 12/02/2002 Document Revised: 03/05/2017 Document Reviewed: 03/05/2017 Elsevier Interactive Patient Education  2019 [employer].   Follow-Up: At [facility], you and your health needs are our priority.  As part of our continuing mission to provide you with exceptional heart care, we have created designated Provider Care Teams.  These Care Teams include your primary Cardiologist (physician) and Advanced Practice Providers (APPs -  Physician Assistants and Nurse Practitioners) who all work together to provide you with the care you need, when you need it. You will need a follow up appointment in 3 months.

## 2018-09-01 NOTE — Telephone Encounter (Signed)
Information relayed to patient, no further questions. Her systolic BP's have been [id number]'X. She is keeping log of her BP's with no other concerns at the moment.

## 2018-09-04 DIAGNOSIS — M2170 Unequal limb length (acquired), unspecified site: Secondary | ICD-10-CM | POA: Insufficient documentation

## 2018-09-04 DIAGNOSIS — R269 Unspecified abnormalities of gait and mobility: Secondary | ICD-10-CM

## 2018-09-04 HISTORY — DX: Unequal limb length (acquired), unspecified site: M21.70

## 2018-09-04 HISTORY — DX: Unspecified abnormalities of gait and mobility: R26.9

## 2018-10-08 ENCOUNTER — Encounter: Payer: Medicare Other | Admitting: *Deleted

## 2018-10-09 ENCOUNTER — Telehealth: Payer: Self-pay

## 2018-10-09 NOTE — Telephone Encounter (Signed)
Left message for patient to remind of missed remote transmission.  

## 2018-10-23 DIAGNOSIS — M25571 Pain in right ankle and joints of right foot: Secondary | ICD-10-CM | POA: Insufficient documentation

## 2018-10-23 DIAGNOSIS — R600 Localized edema: Secondary | ICD-10-CM

## 2018-10-23 HISTORY — DX: Localized edema: R60.0

## 2018-10-23 HISTORY — DX: Pain in right ankle and joints of right foot: M25.571

## 2018-10-24 ENCOUNTER — Telehealth: Payer: Self-pay | Admitting: Cardiology

## 2018-10-24 NOTE — Telephone Encounter (Signed)
Her BP is up and she said Dr Garlon Hatchet wants her to see [facility] sooner than her scheduled appt in Sept

## 2018-10-28 ENCOUNTER — Telehealth: Payer: Self-pay | Admitting: Cardiology

## 2018-10-28 NOTE — Telephone Encounter (Signed)
Her BP is extremely high, went to ER yesterday

## 2018-10-28 NOTE — Telephone Encounter (Signed)
Patient states her BP 197/107 yesterday and she went to [facility]. She has been issues since her losartan was decreased, bilateral edema/pain. No sob. Patient was put back on losartan in ED. Scheduled to come in for f/u visit with Dr. Docia Furl tomorrow at 1:55, [facility] records printed.

## 2018-10-29 ENCOUNTER — Telehealth: Payer: Self-pay | Admitting: Cardiology

## 2018-10-29 ENCOUNTER — Encounter: Payer: Self-pay | Admitting: Cardiology

## 2018-10-29 ENCOUNTER — Ambulatory Visit (INDEPENDENT_AMBULATORY_CARE_PROVIDER_SITE_OTHER): Payer: Medicare Other | Admitting: Cardiology

## 2018-10-29 VITALS — BP 126/68 | HR 86 | Temp 96.1°F | Ht 66.0 in | Wt 178.0 lb

## 2018-10-29 DIAGNOSIS — G4733 Obstructive sleep apnea (adult) (pediatric): Secondary | ICD-10-CM

## 2018-10-29 DIAGNOSIS — E782 Mixed hyperlipidemia: Secondary | ICD-10-CM | POA: Diagnosis not present

## 2018-10-29 DIAGNOSIS — Z9989 Dependence on other enabling machines and devices: Secondary | ICD-10-CM

## 2018-10-29 DIAGNOSIS — I10 Essential (primary) hypertension: Secondary | ICD-10-CM | POA: Diagnosis not present

## 2018-10-29 MED ORDER — FUROSEMIDE 40 MG PO TABS: 40.0000 mg | ORAL_TABLET | Freq: Every day | ORAL | 3 refills | Status: DC

## 2018-10-29 MED ORDER — LOSARTAN POTASSIUM 100 MG PO TABS: 100.0000 mg | ORAL_TABLET | Freq: Every day | ORAL | 2 refills | Status: DC

## 2018-10-29 NOTE — Progress Notes (Signed)
Cardiology Office Note:    Date:  10/29/2018   ID:  Denise RogersSybil B Francis, DOB 10/02/1935, MRN [id number]  PCP:  Olive Bassough, Robert L, MD  Cardiologist:  Garwin Brothersajan R Leonor Darnell, MD   Referring MD: Olive Bassough, Robert L, MD    ASSESSMENT:    1. Essential hypertension   2. OSA on CPAP   3. Mixed dyslipidemia    PLAN:    In order of problems listed above:  I reassured the patient about my findings.  Patient is back on losartan at the same dose that she was previously on.  She needs refills and we will do this for her.  She has pedal edema issues and a DVT study at [facility] was negative.  I will stop her verapamil and I will initiate her on 40 mg of furosemide on a daily basis.  She will be seen in follow-up appointment in 2 weeks or earlier if she has any concerns.  She knows to go to the nearest emergency room for concerning symptoms.  Her blood pressure stable at this time.  I reviewed lab work and also echocardiogram done in the past at [facility] and discussed my findings with her.  At extensive length.  Total time for this evaluation was 30 minutes.  Medication Adjustments/Labs and Tests Ordered: Current medicines are reviewed at length with the patient today.  Concerns regarding medicines are outlined above.  No orders of the defined types were placed in this encounter.  No orders of the defined types were placed in this encounter.    No chief complaint on file.    History of Present Illness:    Denise Francis is a 83 y.o. female.  Patient has past medical history of essential hypertension, sick sinus syndrome and pacemaker.  Patient mentions to me that her losartan was reduced by me as far as dosages concerned and she landed up in the emergency room with very high blood pressure and she was put back on the same medication at the same dose.  This is improved her blood pressure situation.  She also had pedal edema right more than the left and DVT study was negative.  I have  those reports.  I also [facility] extensively.  Patient mentions to me that she is having issues with pedal edema and wants it to be better.  At the time of my evaluation, the patient is alert awake oriented and in no distress.  Past Medical History:  Diagnosis Date  . Arthropathy    nos, multiple sites with polymyalgia rheumatica  . Asthma   . CRI (chronic renal insufficiency), stage 3 (moderate) (HCC)   . Diverticulitis of colon with hemorrhage   . GERD (gastroesophageal reflux disease)   . Gout   . H/O iron deficiency anemia   . Hyperlipidemia   . Hypertension     Past Surgical History:  Procedure Laterality Date  . BACK SURGERY     x2  . COLONOSCOPY  08/16/2010   Attempted. Moderate-to-severe sigmoid diverticulosis. Otherwise grossly normal attempted colonoscopy up to 40 cm (The patient had very poor preparation)  . ESOPHAGOGASTRODUODENOSCOPY  12/20/2010   Small hiatal hernia. Mild gastritis  . KNEE SURGERY    . WRIST SURGERY      Current Medications: Current Meds  Medication Sig  . aspirin EC 81 MG tablet Take 81 mg by mouth daily.   Marland Kitchen. atorvastatin (LIPITOR) 10 MG tablet TAKE 1 TABLET DAILY (Patient taking differently: 20 mg at  bedtime. )  . calcitRIOL (ROCALTROL) 0.25 MCG capsule Take 0.25 mcg daily by mouth.  . cetirizine (ZYRTEC) 10 MG tablet Take 10 mg by mouth daily.   . diazepam (VALIUM) 2 MG tablet Take 2 mg by mouth as needed for anxiety.   . fluticasone (FLONASE) 50 MCG/ACT nasal spray Place 2 sprays into both nostrils daily.   Marland Kitchen gabapentin (NEURONTIN) 300 MG capsule Take 300 mg daily by mouth.  . montelukast (SINGULAIR) 10 MG tablet Take 10 mg by mouth at bedtime.   Marland Kitchen omeprazole (PRILOSEC) 20 MG capsule Take 20 mg daily by mouth.  . ranitidine (ZANTAC) 150 MG tablet Take 1 tablet by mouth at bedtime.   Marland Kitchen rOPINIRole (REQUIP) 0.25 MG tablet Take 0.25 mg by mouth daily. Take 2 tablets daily  . traMADol (ULTRAM) 50 MG tablet Take 50 mg  daily as needed by mouth.  . verapamil (CALAN-SR) 180 MG CR tablet Take 180 mg by mouth 2 (two) times daily.      Allergies:   Amoxicillin, Clarithromycin, Flagyl [metronidazole], and Sulfamethoxazole-trimethoprim   Social History   Socioeconomic History  . Marital status: Widowed    Spouse name: Not on file  . Number of children: Not on file  . Years of education: Not on file  . Highest education level: Not on file  Occupational History  . Not on file  Social Needs  . Financial resource strain: Not on file  . Food insecurity    Worry: Not on file    Inability: Not on file  . Transportation needs    Medical: Not on file    Non-medical: Not on file  Tobacco Use  . Smoking status: Never Smoker  . Smokeless tobacco: Never Used  Substance and Sexual Activity  . Alcohol use: Yes    Frequency: Never    Comment: rare  . Drug use: No  . Sexual activity: Not on file  Lifestyle  . Physical activity    Days per week: Not on file    Minutes per session: Not on file  . Stress: Not on file  Relationships  . Social Herbalist on phone: Not on file    Gets together: Not on file    Attends religious service: Not on file    Active member of club or organization: Not on file    Attends meetings of clubs or organizations: Not on file    Relationship status: Not on file  Other Topics Concern  . Not on file  Social History Narrative  . Not on file     Family History: The patient's family history includes COPD in her mother; Heart attack in her mother; Heart failure in her father; Kidney failure in her father. There is no history of Colon cancer or Esophageal cancer.  ROS:   Please see the history of present illness.    All other systems reviewed and are negative.  EKGs/Labs/Other Studies Reviewed:    The following studies were reviewed today: I reviewed hospital records extensively including DVT study which was negative   Recent Labs: No results found for  requested labs within last 8760 hours.  Recent Lipid Panel No results found for: CHOL, TRIG, HDL, CHOLHDL, VLDL, LDLCALC, LDLDIRECT  Physical Exam:    VS:  BP 126/68 (BP Location: Left Arm, Patient Position: Sitting)   Pulse 86   Temp (!) 96.1 F (35.6 C)   Ht 5\' 6"  (1.676 m)   Wt 178 lb (80.7 kg)  SpO2 96%   BMI 28.73 kg/m     Wt Readings from Last 3 Encounters:  10/29/18 178 lb (80.7 kg)  08/29/18 177 lb (80.3 kg)  02/25/18 177 lb (80.3 kg)     GEN: Patient is in no acute distress HEENT: Normal NECK: No JVD; No carotid bruits LYMPHATICS: No lymphadenopathy CARDIAC: Hear sounds regular, 2/6 systolic murmur at the apex. RESPIRATORY:  Clear to auscultation without rales, wheezing or rhonchi  ABDOMEN: Soft, non-tender, non-distended MUSCULOSKELETAL:  No edema; No deformity  SKIN: Warm and dry NEUROLOGIC:  Alert and oriented x 3 PSYCHIATRIC:  Normal affect   Signed, Garwin Brothersajan R Bessie Boyte, MD  10/29/2018 2:57 PM    [facility]

## 2018-10-29 NOTE — Telephone Encounter (Signed)
Patient is asking for her furosemide to be called into to [facility] drug for 30 days so she can start taking it right away instead of waiting on [facility].

## 2018-10-29 NOTE — Patient Instructions (Addendum)
Medication Instructions:  Your physician has recommended you make the following change in your medication:  STOP taking verapamil  START taking furosemide 40 mg (1 tablet) once daily  If you need a refill on your cardiac medications before your next appointment, please call your pharmacy.   Lab work: NONE If you have labs (blood work) drawn today and your tests are completely normal, you will receive your results only by: Marland Kitchen MyChart Message (if you have MyChart) OR . A paper copy in the mail If you have any lab test that is abnormal or we need to change your treatment, we will call you to review the results.  Testing/Procedures: NONE  Follow-Up: At [facility], you and your health needs are our priority.  As part of our continuing mission to provide you with exceptional heart care, we have created designated Provider Care Teams.  These Care Teams include your primary Cardiologist (physician) and Advanced Practice Providers (APPs -  Physician Assistants and Nurse Practitioners) who all work together to provide you with the care you need, when you need it. You will need a follow up appointment in 2 weeks.

## 2018-10-30 ENCOUNTER — Telehealth: Payer: Self-pay | Admitting: Cardiology

## 2018-10-30 MED ORDER — FUROSEMIDE 40 MG PO TABS: 40.0000 mg | ORAL_TABLET | Freq: Every day | ORAL | 3 refills | Status: DC

## 2018-10-30 MED ORDER — FUROSEMIDE 40 MG PO TABS: 40.0000 mg | ORAL_TABLET | Freq: Every day | ORAL | 0 refills | Status: DC

## 2018-10-30 MED ORDER — ATORVASTATIN CALCIUM 40 MG PO TABS: 40.0000 mg | ORAL_TABLET | Freq: Every day | ORAL | 3 refills | Status: AC

## 2018-10-30 NOTE — Addendum Note (Signed)
Addended by: Beckey Rutter on: 10/30/2018 08:04 AM   Modules accepted: Orders

## 2018-10-30 NOTE — Telephone Encounter (Signed)
Telephone call to patient. She needs a 30 day prescription for furosemide while waiting for her [facility] scripts to come in. Furosemide called in to [facility] drug as requested.

## 2018-10-30 NOTE — Telephone Encounter (Signed)
States she was supposed to get a call back yesterday and she is a little perturbed because no one called her

## 2018-10-31 ENCOUNTER — Telehealth: Payer: Self-pay | Admitting: *Deleted

## 2018-10-31 NOTE — Telephone Encounter (Signed)
Telephone call from patient . Concered regarding blood pressure 151/101 HR 74. Has not taken her furosemide yet . She just got it filled . Recommended to take her furosemide, rest and track blood pressures over the weekend. If still high she can either go to the emergency room or call the on call doctor for advice. She wants an appointment for MOnday but all slots are booked. Pt not happy but understands.

## 2018-11-05 ENCOUNTER — Telehealth: Payer: Self-pay

## 2018-11-05 NOTE — Telephone Encounter (Signed)
Pt called to get help sending a transmission with her home monitor. The monitor would not turn on. She checked to make sure it was plugged in and it was. We tried to get it to work but was unsuccessful. I gave her the number to [employer] tech support to get additional help. I also gave her my direct office number.

## 2018-11-28 ENCOUNTER — Ambulatory Visit: Payer: Medicare Other | Admitting: Cardiology

## 2018-12-01 DIAGNOSIS — H539 Unspecified visual disturbance: Secondary | ICD-10-CM

## 2018-12-01 DIAGNOSIS — E785 Hyperlipidemia, unspecified: Secondary | ICD-10-CM

## 2018-12-01 DIAGNOSIS — R262 Difficulty in walking, not elsewhere classified: Secondary | ICD-10-CM | POA: Diagnosis not present

## 2018-12-01 DIAGNOSIS — I10 Essential (primary) hypertension: Secondary | ICD-10-CM

## 2018-12-02 DIAGNOSIS — H539 Unspecified visual disturbance: Secondary | ICD-10-CM | POA: Diagnosis not present

## 2018-12-02 DIAGNOSIS — E785 Hyperlipidemia, unspecified: Secondary | ICD-10-CM | POA: Diagnosis not present

## 2018-12-02 DIAGNOSIS — R262 Difficulty in walking, not elsewhere classified: Secondary | ICD-10-CM | POA: Diagnosis not present

## 2018-12-02 DIAGNOSIS — I10 Essential (primary) hypertension: Secondary | ICD-10-CM | POA: Diagnosis not present

## 2018-12-05 ENCOUNTER — Telehealth: Payer: Self-pay

## 2018-12-05 NOTE — Telephone Encounter (Signed)
Spoke with Mrs. Denise Francis. She is very concerned about her blood pressure. Was at [facility] 12/04/18 with elevated blood pressures. She was discharged with PRN Hydralazine for BP >170 and Xanax. Reviewed her discharge summary from [facility].  She spoke with Bartolo Darter earlier and her BP was 172/107 and she took Hydralazine 25mg  around 3 PM. She rechecked it recently and her blood pressure is 175/107. She has not taken any of the Xanax since being given them at the hospital.   She continues to take her Losartan 100mg  daily.   Tells me she has an arm blood pressure cuff. I have asked her to bring it to her next office visit to check its accuracy.   We discussed that stress, anxiety, eating a meal recently, and activity can increase her blood pressure.   She will take a Xanax now. She will recheck her blood pressure this evening.   I have asked her to check her blood pressure twice per day, once in the morning and once in the evening. Asked her to sit and rest prior to checking her blood pressure. She will take Hydralazine as needed for systolic blood pressure >086 when she checks her blood pressure in the morning and evening. Reassurance and education provided that we do not want to drop her blood pressure to quickly.   Loel Dubonnet, NP

## 2018-12-05 NOTE — Telephone Encounter (Signed)
Dr. Docia Furl attempted to call patient back but she is already on phone with Terie Purser, NP.

## 2018-12-05 NOTE — Telephone Encounter (Signed)
She just took BP 172/107 and she took hydralazine.

## 2018-12-05 NOTE — Telephone Encounter (Signed)
Patient states she is feeling well today and improved since her hospital visit. She has not checked BP because she had an emergency with her sister. Patient states she will call office if something changes. No further questions.

## 2018-12-08 NOTE — Telephone Encounter (Signed)
nice job Engineer, petroleum. Thank you

## 2018-12-08 NOTE — Telephone Encounter (Signed)
Called to check on blood pressure at the request of Dr. Geraldo Pitter.   Reports she has only taken Hydralazine once the weekend for SBP >160. She continues to check her BP twice per day. She has not checked it yet this morning. She is writing down her blood pressures.   Reports she had a "rough weekend" as her sister fell and broke her wrist. She has taken two of the Xanax. Once when instructed after our phone call on Friday. Took another one night before bed. Tells me it seems to "calm her down". Tells me she is also on Fluoxetine (Prozac) which she doesn't feel helps her. I asked her to schedule an appointment with her PCP as further refills of the Xanax will need to come from her PCP.   Loel Dubonnet, NP

## 2018-12-08 NOTE — Telephone Encounter (Signed)
Called [name] back at her request. Her BP this morning was 131/78. She is very pleased. Will continue plan to check BP twice daily and take PRN Hydralazin for SBP >160. Reiterated to call her PCP for an office visit if she believes she will need refills of the Xanax.   Denise Dubonnet, NP

## 2018-12-11 DIAGNOSIS — R197 Diarrhea, unspecified: Secondary | ICD-10-CM

## 2018-12-11 HISTORY — DX: Diarrhea, unspecified: R19.7

## 2018-12-16 ENCOUNTER — Telehealth: Payer: Self-pay | Admitting: Cardiology

## 2018-12-16 MED ORDER — LOSARTAN POTASSIUM 100 MG PO TABS: 100.0000 mg | ORAL_TABLET | Freq: Every day | ORAL | 2 refills | Status: DC

## 2018-12-16 NOTE — Telephone Encounter (Signed)
Patient called and still having issues keeping her BP undercontrol. RN moved up f/u appt to 12/19/18 with Dr. Docia Furl.

## 2018-12-16 NOTE — Telephone Encounter (Signed)
Wants you to call her back/would not tell me what it's in reference to

## 2018-12-19 ENCOUNTER — Ambulatory Visit (INDEPENDENT_AMBULATORY_CARE_PROVIDER_SITE_OTHER): Payer: Medicare Other | Admitting: Cardiology

## 2018-12-19 ENCOUNTER — Encounter: Payer: Self-pay | Admitting: Cardiology

## 2018-12-19 ENCOUNTER — Other Ambulatory Visit: Payer: Self-pay

## 2018-12-19 VITALS — BP 168/64 | HR 86 | Wt 176.2 lb

## 2018-12-19 DIAGNOSIS — E7800 Pure hypercholesterolemia, unspecified: Secondary | ICD-10-CM

## 2018-12-19 DIAGNOSIS — E782 Mixed hyperlipidemia: Secondary | ICD-10-CM | POA: Diagnosis not present

## 2018-12-19 DIAGNOSIS — Z950 Presence of cardiac pacemaker: Secondary | ICD-10-CM | POA: Diagnosis not present

## 2018-12-19 DIAGNOSIS — I10 Essential (primary) hypertension: Secondary | ICD-10-CM | POA: Diagnosis not present

## 2018-12-19 MED ORDER — CHLORTHALIDONE 25 MG PO TABS: 12.5000 mg | ORAL_TABLET | Freq: Every day | ORAL | 4 refills | Status: DC

## 2018-12-19 NOTE — Addendum Note (Signed)
Addended by: Beckey Rutter on: 12/19/2018 03:22 PM   Modules accepted: Orders

## 2018-12-19 NOTE — Patient Instructions (Signed)
Medication Instructions:  Your physician has recommended you make the following change in your medication:   You will start taking chlorhtalidone 12.5 mg (0.5 tablet) once daily If you need a refill on your cardiac medications before your next appointment, please call your pharmacy.   Lab work: Your physician recommends that you have a BMP drawn today.  If you have labs (blood work) drawn today and your tests are completely normal, you will receive your results only by: Marland Kitchen MyChart Message (if you have MyChart) OR . A paper copy in the mail If you have any lab test that is abnormal or we need to change your treatment, we will call you to review the results.  Testing/Procedures: NONE  Follow-Up: At [facility], you and your health needs are our priority.  As part of our continuing mission to provide you with exceptional heart care, we have created designated Provider Care Teams.  These Care Teams include your primary Cardiologist (physician) and Advanced Practice Providers (APPs -  Physician Assistants and Nurse Practitioners) who all work together to provide you with the care you need, when you need it. You will need a follow up appointment in 6 months.   Any Other Special Instructions Will Be Listed Below

## 2018-12-19 NOTE — Progress Notes (Signed)
Cardiology Office Note:    Date:  12/19/2018   ID:  Denise Francis, DOB 19-Oct-1935, MRN [id number]  PCP:  Algis Greenhouse, MD  Cardiologist:  Jenean Lindau, MD   Referring MD: Algis Greenhouse, MD    ASSESSMENT:    1. Essential hypertension   2. Hypercholesterolemia   3. Mixed dyslipidemia   4. Presence of permanent cardiac pacemaker    PLAN:    In order of problems listed above:  1. Essential hypertension: Patient is very anxious about her blood pressure.  Her primary care physician has given her Xanax to be used as needed and I agree with it.  In view of the above and after reviewing her record and log of her blood pressures have added h chlorthalidone 25 mg half tablet daily.  She will keep a track of her blood pressures and be back in 1 week for a pulse blood pressure check.  She knows to go to the nearest emergency room for any concerning symptoms. 2. Permanent pacemaker: This was checked about 2 months ago and found to be satisfactory according to the patient. 3. Patient will be seen in follow-up appointment in 6 weeks or earlier if the patient has any concerns   Medication Adjustments/Labs and Tests Ordered: Current medicines are reviewed at length with the patient today.  Concerns regarding medicines are outlined above.  No orders of the defined types were placed in this encounter.  No orders of the defined types were placed in this encounter.    No chief complaint on file.    History of Present Illness:    Denise Francis is a 83 y.o. female.  Patient has history of essential hypertension.  She was diagnosed to have stroke treated and released.  She is accompanied by her daughter today.  She is extremely anxious after her stroke according to the daughter.  This is driving her blood pressure.  She denies any chest pain orthopnea or PND.  At the time of my evaluation, the patient is alert awake oriented and in no distress.  Past Medical History:  Diagnosis  Date  . Arthropathy    nos, multiple sites with polymyalgia rheumatica  . Asthma   . CRI (chronic renal insufficiency), stage 3 (moderate)   . Diverticulitis of colon with hemorrhage   . GERD (gastroesophageal reflux disease)   . Gout   . H/O iron deficiency anemia   . Hyperlipidemia   . Hypertension     Past Surgical History:  Procedure Laterality Date  . BACK SURGERY     x2  . COLONOSCOPY  08/16/2010   Attempted. Moderate-to-severe sigmoid diverticulosis. Otherwise grossly normal attempted colonoscopy up to 40 cm (The patient had very poor preparation)  . ESOPHAGOGASTRODUODENOSCOPY  12/20/2010   Small hiatal hernia. Mild gastritis  . KNEE SURGERY    . WRIST SURGERY      Current Medications: No outpatient medications have been marked as taking for the 12/19/18 encounter (Office Visit) with Madina Galati, Reita Cliche, MD.     Allergies:   Amoxicillin, Clarithromycin, Flagyl [metronidazole], and Sulfamethoxazole-trimethoprim   Social History   Socioeconomic History  . Marital status: Widowed    Spouse name: Not on file  . Number of children: Not on file  . Years of education: Not on file  . Highest education level: Not on file  Occupational History  . Not on file  Social Needs  . Financial resource strain: Not on file  . Food insecurity  Worry: Not on file    Inability: Not on file  . Transportation needs    Medical: Not on file    Non-medical: Not on file  Tobacco Use  . Smoking status: Never Smoker  . Smokeless tobacco: Never Used  Substance and Sexual Activity  . Alcohol use: Yes    Frequency: Never    Comment: rare  . Drug use: No  . Sexual activity: Not on file  Lifestyle  . Physical activity    Days per week: Not on file    Minutes per session: Not on file  . Stress: Not on file  Relationships  . Social Musician on phone: Not on file    Gets together: Not on file    Attends religious service: Not on file    Active member of club or  organization: Not on file    Attends meetings of clubs or organizations: Not on file    Relationship status: Not on file  Other Topics Concern  . Not on file  Social History Narrative  . Not on file     Family History: The patient's family history includes COPD in her mother; Heart attack in her mother; Heart failure in her father; Kidney failure in her father. There is no history of Colon cancer or Esophageal cancer.  ROS:   Please see the history of present illness.    All other systems reviewed and are negative.  EKGs/Labs/Other Studies Reviewed:    The following studies were reviewed today: EKG revealed paced rhythm and this was obtained from her hospital records.   Recent Labs: No results found for requested labs within last 8760 hours.  Recent Lipid Panel No results found for: CHOL, TRIG, HDL, CHOLHDL, VLDL, LDLCALC, LDLDIRECT  Physical Exam:    VS:  BP (!) 168/64 (BP Location: Left Arm, Patient Position: Sitting, Cuff Size: Normal)   Wt 176 lb 3.2 oz (79.9 kg)   BMI 28.44 kg/m     Wt Readings from Last 3 Encounters:  12/19/18 176 lb 3.2 oz (79.9 kg)  10/29/18 178 lb (80.7 kg)  08/29/18 177 lb (80.3 kg)     GEN: Patient is in no acute distress HEENT: Normal NECK: No JVD; No carotid bruits LYMPHATICS: No lymphadenopathy CARDIAC: Hear sounds regular, 2/6 systolic murmur at the apex. RESPIRATORY:  Clear to auscultation without rales, wheezing or rhonchi  ABDOMEN: Soft, non-tender, non-distended MUSCULOSKELETAL:  No edema; No deformity  SKIN: Warm and dry NEUROLOGIC:  Alert and oriented x 3 PSYCHIATRIC:  Normal affect   Signed, Garwin Brothers, MD  12/19/2018 3:03 PM    [facility]

## 2018-12-20 LAB — BASIC METABOLIC PANEL
BUN/Creatinine Ratio: 17 (ref 12–28)
BUN: 23 mg/dL (ref 8–27)
CO2: 23 mmol/L (ref 20–29)
Calcium: 9 mg/dL (ref 8.7–10.3)
Chloride: 103 mmol/L (ref 96–106)
Creatinine, Ser: 1.39 mg/dL — ABNORMAL HIGH (ref 0.57–1.00)
GFR calc Af Amer: 41 mL/min/{1.73_m2} — ABNORMAL LOW (ref >59)
GFR calc non Af Amer: 36 mL/min/{1.73_m2} — ABNORMAL LOW (ref >59)
Glucose: 165 mg/dL — ABNORMAL HIGH (ref 65–99)
Potassium: 4.1 mmol/L (ref 3.5–5.2)
Sodium: 141 mmol/L (ref 134–144)

## 2018-12-22 ENCOUNTER — Ambulatory Visit: Payer: Medicare Other | Admitting: Cardiology

## 2018-12-25 ENCOUNTER — Telehealth: Payer: Self-pay

## 2018-12-25 DIAGNOSIS — R748 Abnormal levels of other serum enzymes: Secondary | ICD-10-CM

## 2018-12-25 NOTE — Telephone Encounter (Signed)
Patient states she is in stage 3 kidney function but will try to stay hydrated. She will come back in one week for repeat labs.

## 2018-12-25 NOTE — Telephone Encounter (Signed)
-----   Message from Jenean Lindau, MD sent at 12/20/2018  5:04 PM EDT ----- Ensure adequate hydration. Recheck in one week Jenean Lindau, MD 12/20/2018 5:04 PM

## 2019-01-30 ENCOUNTER — Encounter: Payer: Self-pay | Admitting: Cardiology

## 2019-01-30 ENCOUNTER — Other Ambulatory Visit: Payer: Self-pay

## 2019-01-30 ENCOUNTER — Ambulatory Visit (INDEPENDENT_AMBULATORY_CARE_PROVIDER_SITE_OTHER): Payer: Medicare Other | Admitting: Cardiology

## 2019-01-30 VITALS — BP 98/60 | HR 88 | Ht 66.0 in | Wt 176.4 lb

## 2019-01-30 DIAGNOSIS — I10 Essential (primary) hypertension: Secondary | ICD-10-CM

## 2019-01-30 DIAGNOSIS — E7800 Pure hypercholesterolemia, unspecified: Secondary | ICD-10-CM

## 2019-01-30 MED ORDER — LOSARTAN POTASSIUM 50 MG PO TABS: 50.0000 mg | ORAL_TABLET | Freq: Every day | ORAL | 2 refills | Status: DC

## 2019-01-30 NOTE — Progress Notes (Signed)
Cardiology Office Note:    Date:  01/30/2019   ID:  [name], DOB [birth year], MRN [id number]  PCP:  Algis Greenhouse, MD  Cardiologist:  Jenean Lindau, MD   Referring MD: Algis Greenhouse, MD    ASSESSMENT:    1. Hypercholesterolemia    PLAN:    In order of problems listed above:  1. Essential hypertension: Her blood pressure stable.  She feels tired as it is borderline at times and therefore I will cut down her losartan to 50 mg daily.  She will keep a track of her blood pressures.  If they are elevated she will get in touch with [place].  She will have a Chem-7 today. 2. Mixed dyslipidemia: Lipids are stable and managed by primary care physician.  Diet was discussed 3. Patient will be seen in follow-up appointment in 6 months or earlier if the patient has any concerns    Medication Adjustments/Labs and Tests Ordered: Current medicines are reviewed at length with the patient today.  Concerns regarding medicines are outlined above.  No orders of the defined types were placed in this encounter.  No orders of the defined types were placed in this encounter.    Chief Complaint  Patient presents with  . 6 week follow up     History of Present Illness:    Denise Francis is a 83 y.o. female.  Patient has past medical history of essential hypertension and dyslipidemia.  She denies any problems at this time and takes care of activities of daily living.  No chest pain orthopnea or PND.  She is not taking the chlorthalidone as she feels her blood pressure is fine.  She is brought multiple blood pressure readings and they are borderline at times.  Past Medical History:  Diagnosis Date  . Arthropathy    nos, multiple sites with polymyalgia rheumatica  . Asthma   . CRI (chronic renal insufficiency), stage 3 (moderate)   . Diverticulitis of colon with hemorrhage   . GERD (gastroesophageal reflux disease)   . Gout   . H/O iron deficiency anemia   . Hyperlipidemia   .  Hypertension     Past Surgical History:  Procedure Laterality Date  . BACK SURGERY     x2  . COLONOSCOPY  08/16/2010   Attempted. Moderate-to-severe sigmoid diverticulosis. Otherwise grossly normal attempted colonoscopy up to 40 cm (The patient had very poor preparation)  . ESOPHAGOGASTRODUODENOSCOPY  12/20/2010   Small hiatal hernia. Mild gastritis  . KNEE SURGERY    . WRIST SURGERY      Current Medications: Current Meds  Medication Sig  . aspirin EC 81 MG tablet Take 81 mg by mouth daily.   Marland Kitchen atorvastatin (LIPITOR) 40 MG tablet Take 1 tablet (40 mg total) by mouth daily.  . cetirizine (ZYRTEC) 10 MG tablet Take 10 mg by mouth daily.   . clopidogrel (PLAVIX) 75 MG tablet Take by mouth.  . diazepam (VALIUM) 2 MG tablet Take 2 mg by mouth as needed for anxiety.   . fluticasone (FLONASE) 50 MCG/ACT nasal spray Place 2 sprays into both nostrils daily.   Marland Kitchen gabapentin (NEURONTIN) 300 MG capsule Take 300 mg daily by mouth.  . hydrALAZINE (APRESOLINE) 25 MG tablet Take 25 mg by mouth as needed (as needed for systolic blood pressure >831 when you check in the morning and evening).  . montelukast (SINGULAIR) 10 MG tablet Take 10 mg by mouth at bedtime.   Marland Kitchen omeprazole (PRILOSEC) 20  MG capsule Take 20 mg daily by mouth.  Marland Kitchen rOPINIRole (REQUIP) 0.25 MG tablet Take 0.25 mg by mouth daily. Take 2 tablets daily  . traMADol (ULTRAM) 50 MG tablet Take 50 mg daily as needed by mouth.  . [DISCONTINUED] chlorthalidone (HYGROTON) 25 MG tablet Take 0.5 tablets (12.5 mg total) by mouth daily.     Allergies:   Amoxicillin, Clarithromycin, Flagyl [metronidazole], and Sulfamethoxazole-trimethoprim   Social History   Socioeconomic History  . Marital status: Widowed    Spouse name: Not on file  . Number of children: Not on file  . Years of education: Not on file  . Highest education level: Not on file  Occupational History  . Not on file  Social Needs  . Financial resource strain: Not on file  .  Food insecurity    Worry: Not on file    Inability: Not on file  . Transportation needs    Medical: Not on file    Non-medical: Not on file  Tobacco Use  . Smoking status: Never Smoker  . Smokeless tobacco: Never Used  Substance and Sexual Activity  . Alcohol use: Yes    Frequency: Never    Comment: rare  . Drug use: No  . Sexual activity: Not on file  Lifestyle  . Physical activity    Days per week: Not on file    Minutes per session: Not on file  . Stress: Not on file  Relationships  . Social Musician on phone: Not on file    Gets together: Not on file    Attends religious service: Not on file    Active member of club or organization: Not on file    Attends meetings of clubs or organizations: Not on file    Relationship status: Not on file  Other Topics Concern  . Not on file  Social History Narrative  . Not on file     Family History: The patient's family history includes COPD in her mother; Heart attack in her mother; Heart failure in her father; Kidney failure in her father. There is no history of Colon cancer or Esophageal cancer.  ROS:   Please see the history of present illness.    All other systems reviewed and are negative.  EKGs/Labs/Other Studies Reviewed:    The following studies were reviewed today: I discussed my findings with the patient at length.   Recent Labs: 12/19/2018: BUN 23; Creatinine, Ser 1.39; Potassium 4.1; Sodium 141  Recent Lipid Panel No results found for: CHOL, TRIG, HDL, CHOLHDL, VLDL, LDLCALC, LDLDIRECT  Physical Exam:    VS:  BP 98/60   Pulse 88   Ht 5\' 6"  (1.676 m)   Wt 176 lb 6.4 oz (80 kg)   SpO2 96%   BMI 28.47 kg/m     Wt Readings from Last 3 Encounters:  01/30/19 176 lb 6.4 oz (80 kg)  12/19/18 176 lb 3.2 oz (79.9 kg)  10/29/18 178 lb (80.7 kg)     GEN: Patient is in no acute distress HEENT: Normal NECK: No JVD; No carotid bruits LYMPHATICS: No lymphadenopathy CARDIAC: Hear sounds regular, 2/6  systolic murmur at the apex. RESPIRATORY:  Clear to auscultation without rales, wheezing or rhonchi  ABDOMEN: Soft, non-tender, non-distended MUSCULOSKELETAL:  No edema; No deformity  SKIN: Warm and dry NEUROLOGIC:  Alert and oriented x 3 PSYCHIATRIC:  Normal affect   Signed, 12/29/18, MD  01/30/2019 2:07 PM    [facility]

## 2019-01-30 NOTE — Patient Instructions (Addendum)
Medication Instructions:  Your physician has recommended you make the following change in your medication:   DECREASE losartan to 50 mg (1 tablet) daily  *If you need a refill on your cardiac medications before your next appointment, please call your pharmacy*  Lab Work: Your physician recommends that you have a BMP drawn today  If you have labs (blood work) drawn today and your tests are completely normal, you will receive your results only by: Marland Kitchen MyChart Message (if you have MyChart) OR . A paper copy in the mail If you have any lab test that is abnormal or we need to change your treatment, we will call you to review the results.  Testing/Procedures: NONE  Follow-Up: At [facility], you and your health needs are our priority.  As part of our continuing mission to provide you with exceptional heart care, we have created designated Provider Care Teams.  These Care Teams include your primary Cardiologist (physician) and Advanced Practice Providers (APPs -  Physician Assistants and Nurse Practitioners) who all work together to provide you with the care you need, when you need it.  Your next appointment:   6 months  The format for your next appointment:   In Person  Provider:   Belva Crome, MD    Hypokalemia Hypokalemia means that the amount of potassium in the blood is lower than normal. Potassium is a chemical (electrolyte) that helps regulate the amount of fluid in the body. It also stimulates muscle tightening (contraction) and helps nerves work properly. Normally, most of the body's potassium is inside cells, and only a very small amount is in the blood. Because the amount in the blood is so small, minor changes to potassium levels in the blood can be life-threatening. What are the causes? This condition may be caused by:  Antibiotic medicine.  Diarrhea or vomiting. Taking too much of a medicine that helps you have a bowel movement (laxative) can cause diarrhea and lead  to hypokalemia.  Chronic kidney disease (CKD).  Medicines that help the body get rid of excess fluid (diuretics).  Eating disorders, such as bulimia.  Low magnesium levels in the body.  Sweating a lot. What are the signs or symptoms? Symptoms of this condition include:  Weakness.  Constipation.  Fatigue.  Muscle cramps.  Mental confusion.  Skipped heartbeats or irregular heartbeat (palpitations).  Tingling or numbness. How is this diagnosed? This condition is diagnosed with a blood test. How is this treated? This condition may be treated by:  Taking potassium supplements by mouth.  Adjusting the medicines that you take.  Eating more foods that contain a lot of potassium. If your potassium level is very low, you may need to get potassium through an IV and be monitored in the hospital. Follow these instructions at home:   Take over-the-counter and prescription medicines only as told by your health care provider. This includes vitamins and supplements.  Eat a healthy diet. A healthy diet includes fresh fruits and vegetables, whole grains, healthy fats, and lean proteins.  If instructed, eat more foods that contain a lot of potassium. This includes: ? Nuts, such as peanuts and pistachios. ? Seeds, such as sunflower seeds and pumpkin seeds. ? Peas, lentils, and lima beans. ? Whole grain and bran cereals and breads. ? Fresh fruits and vegetables, such as apricots, avocado, bananas, cantaloupe, kiwi, oranges, tomatoes, asparagus, and potatoes. ? Orange juice. ? Tomato juice. ? Red meats. ? Yogurt.  Keep all follow-up visits as told by your  health care provider. This is important. Contact a health care provider if you:  Have weakness that gets worse.  Feel your heart pounding or racing.  Vomit.  Have diarrhea.  Have diabetes (diabetes mellitus) and you have trouble keeping your blood sugar (glucose) in your target range. Get help right away if you:  Have  chest pain.  Have shortness of breath.  Have vomiting or diarrhea that lasts for more than 2 days.  Faint. Summary  Hypokalemia means that the amount of potassium in the blood is lower than normal.  This condition is diagnosed with a blood test.  Hypokalemia may be treated by taking potassium supplements, adjusting the medicines that you take, or eating more foods that are high in potassium.  If your potassium level is very low, you may need to get potassium through an IV and be monitored in the hospital. This information is not intended to replace advice given to you by your health care provider. Make sure you discuss any questions you have with your health care provider. Document Released: 03/05/2005 Document Revised: 10/16/2017 Document Reviewed: 10/16/2017 Elsevier Patient Education  2020 [employer].

## 2019-01-31 LAB — BASIC METABOLIC PANEL
BUN/Creatinine Ratio: 26 (ref 12–28)
BUN: 33 mg/dL — ABNORMAL HIGH (ref 8–27)
CO2: 26 mmol/L (ref 20–29)
Calcium: 8.8 mg/dL (ref 8.7–10.3)
Chloride: 99 mmol/L (ref 96–106)
Creatinine, Ser: 1.28 mg/dL — ABNORMAL HIGH (ref 0.57–1.00)
GFR calc Af Amer: 45 mL/min/{1.73_m2} — ABNORMAL LOW (ref >59)
GFR calc non Af Amer: 39 mL/min/{1.73_m2} — ABNORMAL LOW (ref >59)
Glucose: 101 mg/dL — ABNORMAL HIGH (ref 65–99)
Potassium: 3.7 mmol/L (ref 3.5–5.2)
Sodium: 141 mmol/L (ref 134–144)

## 2019-02-05 ENCOUNTER — Telehealth: Payer: Self-pay

## 2019-02-05 NOTE — Telephone Encounter (Signed)
Left message to call office for results, copy sent to Dr. Garlon Hatchet

## 2019-02-05 NOTE — Telephone Encounter (Signed)
-----   Message from Jenean Lindau, MD sent at 02/02/2019  8:15 AM EST ----- The results of the study is unremarkable.  Renal function is stable.  Please inform patient. I will discuss in detail at next appointment. Cc  primary care/referring physician Jenean Lindau, MD 02/02/2019 8:15 AM

## 2019-02-19 NOTE — Telephone Encounter (Signed)
Left message that results were ok.

## 2019-02-23 ENCOUNTER — Ambulatory Visit (INDEPENDENT_AMBULATORY_CARE_PROVIDER_SITE_OTHER): Payer: Medicare Other | Admitting: *Deleted

## 2019-02-23 DIAGNOSIS — R001 Bradycardia, unspecified: Secondary | ICD-10-CM | POA: Diagnosis not present

## 2019-02-24 ENCOUNTER — Telehealth: Payer: Self-pay

## 2019-02-24 LAB — CUP PACEART REMOTE DEVICE CHECK
Battery Impedance: 647 Ohm
Battery Remaining Longevity: 84 mo
Battery Voltage: 2.78 V
Brady Statistic AP VP Percent: 5 %
Brady Statistic AP VS Percent: 80 %
Brady Statistic AS VP Percent: 1 %
Brady Statistic AS VS Percent: 14 %
Date Time Interrogation Session: 20201208115532
Implantable Lead Implant Date: 20131107
Implantable Lead Implant Date: 20131107
Implantable Lead Location: 753859
Implantable Lead Location: 753860
Implantable Lead Model: 4092
Implantable Lead Model: 5076
Implantable Pulse Generator Implant Date: 20131107
Lead Channel Impedance Value: 463 Ohm
Lead Channel Impedance Value: 713 Ohm
Lead Channel Pacing Threshold Amplitude: 0.5 V
Lead Channel Pacing Threshold Amplitude: 0.625 V
Lead Channel Pacing Threshold Pulse Width: 0.4 ms
Lead Channel Pacing Threshold Pulse Width: 0.4 ms
Lead Channel Setting Pacing Amplitude: 1.5 V
Lead Channel Setting Pacing Amplitude: 2 V
Lead Channel Setting Pacing Pulse Width: 0.4 ms
Lead Channel Setting Sensing Sensitivity: 1.4 mV

## 2019-02-24 NOTE — Telephone Encounter (Signed)
Spoke with patient to remind of missed remote transmission 

## 2019-03-25 DIAGNOSIS — E559 Vitamin D deficiency, unspecified: Secondary | ICD-10-CM

## 2019-03-25 HISTORY — DX: Vitamin D deficiency, unspecified: E55.9

## 2019-05-25 ENCOUNTER — Ambulatory Visit (INDEPENDENT_AMBULATORY_CARE_PROVIDER_SITE_OTHER): Payer: Medicare Other | Admitting: *Deleted

## 2019-05-25 DIAGNOSIS — R001 Bradycardia, unspecified: Secondary | ICD-10-CM | POA: Diagnosis not present

## 2019-05-28 LAB — CUP PACEART REMOTE DEVICE CHECK
Battery Impedance: 723 Ohm
Battery Remaining Longevity: 79 mo
Battery Voltage: 2.78 V
Brady Statistic AP VP Percent: 5 %
Brady Statistic AP VS Percent: 81 %
Brady Statistic AS VP Percent: 1 %
Brady Statistic AS VS Percent: 14 %
Date Time Interrogation Session: 20210311120959
Implantable Lead Implant Date: 20131107
Implantable Lead Implant Date: 20131107
Implantable Lead Location: 753859
Implantable Lead Location: 753860
Implantable Lead Model: 4092
Implantable Lead Model: 5076
Implantable Pulse Generator Implant Date: 20131107
Lead Channel Impedance Value: 437 Ohm
Lead Channel Impedance Value: 621 Ohm
Lead Channel Pacing Threshold Amplitude: 0.5 V
Lead Channel Pacing Threshold Amplitude: 0.625 V
Lead Channel Pacing Threshold Pulse Width: 0.4 ms
Lead Channel Pacing Threshold Pulse Width: 0.4 ms
Lead Channel Setting Pacing Amplitude: 1.5 V
Lead Channel Setting Pacing Amplitude: 2 V
Lead Channel Setting Pacing Pulse Width: 0.4 ms
Lead Channel Setting Sensing Sensitivity: 1.4 mV

## 2019-05-28 NOTE — Progress Notes (Signed)
PPM Remote  

## 2019-08-24 ENCOUNTER — Ambulatory Visit (INDEPENDENT_AMBULATORY_CARE_PROVIDER_SITE_OTHER): Payer: Medicare Other

## 2019-08-24 DIAGNOSIS — I495 Sick sinus syndrome: Secondary | ICD-10-CM

## 2019-08-25 ENCOUNTER — Telehealth: Payer: Self-pay

## 2019-08-25 LAB — CUP PACEART REMOTE DEVICE CHECK
Battery Impedance: 774 Ohm
Battery Remaining Longevity: 76 mo
Battery Voltage: 2.78 V
Brady Statistic AP VP Percent: 5 %
Brady Statistic AP VS Percent: 81 %
Brady Statistic AS VP Percent: 0 %
Brady Statistic AS VS Percent: 14 %
Date Time Interrogation Session: 20210608110733
Implantable Lead Implant Date: 20131107
Implantable Lead Implant Date: 20131107
Implantable Lead Location: 753859
Implantable Lead Location: 753860
Implantable Lead Model: 4092
Implantable Lead Model: 5076
Implantable Pulse Generator Implant Date: 20131107
Lead Channel Impedance Value: 443 Ohm
Lead Channel Impedance Value: 634 Ohm
Lead Channel Pacing Threshold Amplitude: 0.625 V
Lead Channel Pacing Threshold Amplitude: 0.625 V
Lead Channel Pacing Threshold Pulse Width: 0.4 ms
Lead Channel Pacing Threshold Pulse Width: 0.4 ms
Lead Channel Setting Pacing Amplitude: 1.5 V
Lead Channel Setting Pacing Amplitude: 2 V
Lead Channel Setting Pacing Pulse Width: 0.4 ms
Lead Channel Setting Sensing Sensitivity: 1.4 mV

## 2019-08-25 NOTE — Telephone Encounter (Signed)
Spoke with patient to remind of missed remote transmission 

## 2019-08-26 NOTE — Progress Notes (Signed)
Remote pacemaker transmission.   

## 2019-10-04 ENCOUNTER — Other Ambulatory Visit: Payer: Self-pay | Admitting: Cardiology

## 2019-10-10 ENCOUNTER — Other Ambulatory Visit: Payer: Self-pay | Admitting: Cardiology

## 2019-10-23 ENCOUNTER — Telehealth: Payer: Self-pay | Admitting: Gastroenterology

## 2019-10-23 NOTE — Telephone Encounter (Signed)
Spoke to patient who states that she has been having some fecal incontinence since her stroke last year. An appointment has been scheduled for 12/22/19 and placed on the waiting list for a sooner appointment. All questions answered. Patient voiced understanding

## 2019-10-23 NOTE — Telephone Encounter (Signed)
Pt has been experiencing fecal incontinence lately. She states that it is on and off, sometimes bm is watery and sometimes formed. Pt states that she had a stroke last year so she has been taking different types of medications so she does not know if medications could be causing these episodes. She would like to be seen asap. Nothing available until Oct. Pls call her.

## 2019-12-22 ENCOUNTER — Ambulatory Visit (INDEPENDENT_AMBULATORY_CARE_PROVIDER_SITE_OTHER): Payer: Medicare Other | Admitting: Gastroenterology

## 2019-12-22 ENCOUNTER — Encounter: Payer: Self-pay | Admitting: Gastroenterology

## 2019-12-22 ENCOUNTER — Ambulatory Visit (HOSPITAL_BASED_OUTPATIENT_CLINIC_OR_DEPARTMENT_OTHER)
Admission: RE | Admit: 2019-12-22 | Discharge: 2019-12-22 | Disposition: A | Discharge status: Home / Self Care | Payer: Medicare Other | Source: Ambulatory Visit | Attending: Gastroenterology | Admitting: Gastroenterology

## 2019-12-22 ENCOUNTER — Other Ambulatory Visit: Payer: Self-pay

## 2019-12-22 VITALS — BP 116/68 | HR 77 | Temp 97.3°F | Ht 65.5 in | Wt 180.0 lb

## 2019-12-22 DIAGNOSIS — R159 Full incontinence of feces: Secondary | ICD-10-CM | POA: Diagnosis not present

## 2019-12-22 NOTE — Progress Notes (Signed)
Chief Complaint: FU  Referring Provider:  Olive Bass, MD      ASSESSMENT AND PLAN;   #1. GERD with HH.  #2. Occ fecal incontinence. Neg rectal exam for impaction. Good rectal tone.  #3. Comorbid conditions; CKD3 (Cr 1.29 01/2028), CVA, PMR, gout, HTN, HLD.  Plan: -Fiber 2 TBS qAM, 1 TBS QHS -X ray KUB - supine and upright. -OK to continue famotidine -If still with problems and the above WU is negative, proceed with CT scan abdo/pelvis without IV contrast. -She wants to hold off on colon as before. -If still with problems, would consider anorectal manometry with biofeedback.     HPI:    Denise Francis is a 84 y.o. female   C/O incontinence - stool seepage.  More so since she had stroke a year ago.  1-2 BMs/day, better with fiber 2 TBS po QD. somewhat better.  Reflux is under good control with famotidine.  She is currently off omeprazole.  Denies having any abdominal pain.  No weight loss or loss of appetite.  She has been taking probiotics on regular basis.  Past GI procedures: -EGD 12/2010-small hiatal hernia, mild gastritis.  Negative small bowel biopsies for celiac, negative CLO. -Attempted colonoscopy 07/2010 stopped due to poor preparation.  She refuses to have any further colonoscopies.  Has been offered multiple times.  Past Medical History:  Diagnosis Date  . Arthropathy    nos, multiple sites with polymyalgia rheumatica  . Asthma   . CRI (chronic renal insufficiency), stage 3 (moderate) (HCC)   . Diverticulitis of colon with hemorrhage   . GERD (gastroesophageal reflux disease)   . Gout   . H/O iron deficiency anemia   . Hyperlipidemia   . Hypertension   . Stroke [facility])    Jul 27 2018    Past Surgical History:  Procedure Laterality Date  . BACK SURGERY     x2  . COLONOSCOPY  08/16/2010   Attempted. Moderate-to-severe sigmoid diverticulosis. Otherwise grossly normal attempted colonoscopy up to 40 cm (The patient had very poor  preparation)  . DENTAL SURGERY  12/24/2019   all teeth removed and a plate being placed in   . ESOPHAGOGASTRODUODENOSCOPY  12/20/2010   Small hiatal hernia. Mild gastritis  . KNEE SURGERY    . WRIST SURGERY      Family History  Problem Relation Age of Onset  . Heart attack Mother   . COPD Mother   . Kidney failure Father   . Heart failure Father   . Colon cancer Neg Hx   . Esophageal cancer Neg Hx     Social History   Tobacco Use  . Smoking status: Never Smoker  . Smokeless tobacco: Never Used  Vaping Use  . Vaping Use: Never used  Substance Use Topics  . Alcohol use: Yes    Comment: rare  . Drug use: No    Current Outpatient Medications  Medication Sig Dispense Refill  . atorvastatin (LIPITOR) 40 MG tablet Take 1 tablet (40 mg total) by mouth daily. 30 tablet 3  . calcitRIOL (ROCALTROL) 0.25 MCG capsule Take 0.25 mcg daily by mouth.    . cetirizine (ZYRTEC) 10 MG tablet Take 10 mg by mouth daily.     . clopidogrel (PLAVIX) 75 MG tablet Take by mouth.    . diazepam (VALIUM) 2 MG tablet Take 2 mg by mouth as needed for anxiety.     . famotidine (PEPCID) 40 MG tablet Take 40 mg by mouth  daily.    Marland Kitchen FIBER PO Take by mouth. 2 teaspoon every morning with coffee    . fluticasone (FLONASE) 50 MCG/ACT nasal spray Place 2 sprays into both nostrils daily.     . furosemide (LASIX) 40 MG tablet TAKE 1 TABLET DAILY 90 tablet 0  . gabapentin (NEURONTIN) 300 MG capsule Take 300 mg daily by mouth.    . hydrALAZINE (APRESOLINE) 25 MG tablet Take 25 mg by mouth as needed (as needed for systolic blood pressure >160 when you check in the morning and evening).    Marland Kitchen losartan (COZAAR) 50 MG tablet TAKE 1 TABLET DAILY 90 tablet 0  . montelukast (SINGULAIR) 10 MG tablet Take 10 mg by mouth at bedtime.     . predniSONE (DELTASONE) 1 MG tablet Take 4 tablets by mouth daily.    Marland Kitchen rOPINIRole (REQUIP) 0.25 MG tablet Take 0.5 mg by mouth daily. Take 2 tablets daily    . traMADol (ULTRAM) 50 MG  tablet Take 50 mg daily as needed by mouth.    Marland Kitchen aspirin EC 81 MG tablet Take 81 mg by mouth daily.  (Patient not taking: Reported on 12/22/2019)    . omeprazole (PRILOSEC) 20 MG capsule Take 20 mg daily by mouth. (Patient not taking: Reported on 12/22/2019)     No current facility-administered medications for this visit.    Allergies  Allergen Reactions  . Amoxicillin Nausea And Vomiting  . Clarithromycin Nausea And Vomiting  . Flagyl [Metronidazole] Nausea And Vomiting  . Sulfamethoxazole-Trimethoprim Other (See Comments)    Renal insufficiency    Review of Systems:  Neg except for polymyalgia, joint pains     Physical Exam:    BP 116/68   Pulse 77   Temp (!) 97.3 F (36.3 C)   Ht 5' 5.5" (1.664 m)   Wt 180 lb (81.6 kg)   BMI 29.50 kg/m  Filed Weights   12/22/19 1346  Weight: 180 lb (81.6 kg)  Gen: awake, alert, NAD HEENT: anicteric, no pallor CV: RRR, no mrg Pulm: CTA b/l Abd: soft, NT/ND, +BS throughout Rectal exam: Good rectal tone, soft, heme-negative stools.  No impaction.  Seen in presence of [place] CMA. Ext: no c/c/e Neuro: nonfocal   Data Reviewed: I have personally reviewed following labs and imaging studies   Labs reviewed-from 02/04/2018 normal CMP except creatinine 1.29, GFR 39 mL/min, normal CBC with hemoglobin 13.2, MCV 96.  Sed rate of 15.    Edman Circle, MD 12/22/2019, 2:00 PM  Cc: Olive Bass, MD

## 2019-12-22 NOTE — Patient Instructions (Addendum)
If you are age 82 or older, your body mass index should be between 23-30. Your Body mass index is 29.5 kg/m. If this is out of the aforementioned range listed, please consider follow up with your Primary Care Provider.  If you are age 58 or younger, your body mass index should be between 19-25. Your Body mass index is 29.5 kg/m. If this is out of the aformentioned range listed, please consider follow up with your Primary Care Provider.   Please go to Radiology on the first floor before you leave today to have your abdominal x ray completed   Please purchase the following medications over the counter and take as directed: Fiber two tablespoon in the morning and one tablespoons at bedtime.  Thank you,  Dr. Lynann Bologna

## 2020-01-07 ENCOUNTER — Other Ambulatory Visit: Payer: Self-pay | Admitting: Cardiology

## 2020-01-09 ENCOUNTER — Other Ambulatory Visit: Payer: Self-pay | Admitting: Cardiology

## 2020-01-12 ENCOUNTER — Telehealth: Payer: Self-pay | Admitting: Cardiology

## 2020-01-12 DIAGNOSIS — I10 Essential (primary) hypertension: Secondary | ICD-10-CM

## 2020-01-12 DIAGNOSIS — I272 Pulmonary hypertension, unspecified: Secondary | ICD-10-CM

## 2020-01-12 DIAGNOSIS — E7800 Pure hypercholesterolemia, unspecified: Secondary | ICD-10-CM

## 2020-01-12 MED ORDER — CHLORTHALIDONE 25 MG PO TABS: 12.5000 mg | ORAL_TABLET | Freq: Every day | ORAL | 0 refills | Status: DC

## 2020-01-12 NOTE — Telephone Encounter (Signed)
Always check when the last appointment was and if it is more than 6 months then it is time for an appointment.  In her case it is close to 1 year.  She must get an appointment before you call in the refill.  Please call her refill for about 2 to 3 weeks and give her an appointment before her refill is over.  Also  get a Chem-7 liver lipid check TSH and CBC before next appointment fasting

## 2020-01-12 NOTE — Telephone Encounter (Signed)
I spoke to the patient just now and let her know that we would send in this prescription for her to last up until her already scheduled appointment. She is also agreeable to come in and get this lab work done prior to her appointment date.

## 2020-01-12 NOTE — Telephone Encounter (Signed)
Pt c/o medication issue:  1. Name of Medication: chlorthalidone 25 mg  2. How are you currently taking this medication (dosage and times per day)? Half a tablet daily  3. Are you having a reaction (difficulty breathing--STAT)? no  4. What is your medication issue? Patient states she is out of medication and needs it today. The medication is not listed in her current medication list.

## 2020-01-13 ENCOUNTER — Telehealth: Payer: Self-pay

## 2020-01-13 NOTE — Telephone Encounter (Signed)
Got a fax from [facility] in [place], for Chlorthalidone 01/12/2020, I viewed the chart and it shows they refused this refill.

## 2020-02-01 ENCOUNTER — Other Ambulatory Visit: Payer: Self-pay

## 2020-02-01 MED ORDER — LOSARTAN POTASSIUM 50 MG PO TABS
50.0000 mg | ORAL_TABLET | Freq: Every day | ORAL | 0 refills | Status: DC
Start: 2020-02-01 — End: 2020-06-14

## 2020-02-09 ENCOUNTER — Telehealth: Payer: Self-pay | Admitting: Cardiology

## 2020-02-09 ENCOUNTER — Other Ambulatory Visit: Payer: Self-pay | Admitting: Cardiology

## 2020-02-09 DIAGNOSIS — M129 Arthropathy, unspecified: Secondary | ICD-10-CM | POA: Insufficient documentation

## 2020-02-09 DIAGNOSIS — N183 Chronic kidney disease, stage 3 unspecified: Secondary | ICD-10-CM | POA: Insufficient documentation

## 2020-02-09 DIAGNOSIS — J45909 Unspecified asthma, uncomplicated: Secondary | ICD-10-CM | POA: Insufficient documentation

## 2020-02-09 DIAGNOSIS — E785 Hyperlipidemia, unspecified: Secondary | ICD-10-CM | POA: Insufficient documentation

## 2020-02-09 DIAGNOSIS — K5733 Diverticulitis of large intestine without perforation or abscess with bleeding: Secondary | ICD-10-CM | POA: Insufficient documentation

## 2020-02-09 DIAGNOSIS — I639 Cerebral infarction, unspecified: Secondary | ICD-10-CM | POA: Insufficient documentation

## 2020-02-09 DIAGNOSIS — I10 Essential (primary) hypertension: Secondary | ICD-10-CM | POA: Insufficient documentation

## 2020-02-09 DIAGNOSIS — Z862 Personal history of diseases of the blood and blood-forming organs and certain disorders involving the immune mechanism: Secondary | ICD-10-CM | POA: Insufficient documentation

## 2020-02-09 NOTE — Telephone Encounter (Signed)
I spoke with the pt ans she had labs with Dr. Sol Passer recently and she is not sure what they had drawn on her but she has lab orders for Dr. Nelwyn Salisbury to possibly have done prior to her appt tomorrow but one is for Lipids and she has eaten.   Pt is worried about it and I advised her to relax and not to worry... I called Dr. Robyne Peers office twice but I have not been able to get connected with anyone at his office. She has a contact, Cathlean Cower, at his office that she is going to call and have the labs sent to the [place] office fax so Dr. Nelwyn Salisbury can have at her OV tomorrow for review.

## 2020-02-09 NOTE — Telephone Encounter (Signed)
When speaking to pt about confirming her appt for tomorrow she was confused on to whether or not she was supposed to get lab work done before her appt or if we could just use what Dr. Sol Passer had recently drawn. I told her I would make note in her chart to pull those results and we would figure out the rest tomorrow at her appt. Looking into her chart it appears she had agreed to come in and have blood work done here before her appt, so if we can get the results and advise on if she needs more done tomorrow and/or if she needs to reschedule to a date after results have been received. Thanks, kbl

## 2020-02-10 ENCOUNTER — Encounter: Payer: Self-pay | Admitting: Cardiology

## 2020-02-10 ENCOUNTER — Telehealth: Payer: Self-pay | Admitting: Cardiology

## 2020-02-10 ENCOUNTER — Other Ambulatory Visit: Payer: Self-pay

## 2020-02-10 ENCOUNTER — Telehealth: Payer: Self-pay | Admitting: Emergency Medicine

## 2020-02-10 ENCOUNTER — Ambulatory Visit (INDEPENDENT_AMBULATORY_CARE_PROVIDER_SITE_OTHER): Payer: Medicare Other | Admitting: Cardiology

## 2020-02-10 VITALS — BP 106/70 | HR 66 | Ht 65.5 in | Wt 174.0 lb

## 2020-02-10 DIAGNOSIS — I951 Orthostatic hypotension: Secondary | ICD-10-CM

## 2020-02-10 DIAGNOSIS — Z950 Presence of cardiac pacemaker: Secondary | ICD-10-CM

## 2020-02-10 DIAGNOSIS — E7800 Pure hypercholesterolemia, unspecified: Secondary | ICD-10-CM | POA: Diagnosis not present

## 2020-02-10 DIAGNOSIS — I10 Essential (primary) hypertension: Secondary | ICD-10-CM | POA: Diagnosis not present

## 2020-02-10 HISTORY — DX: Orthostatic hypotension: I95.1

## 2020-02-10 HISTORY — DX: Presence of cardiac pacemaker: Z95.0

## 2020-02-10 MED ORDER — CHLORTHALIDONE 25 MG PO TABS
ORAL_TABLET | ORAL | 0 refills | Status: DC
Start: 2020-02-10 — End: 2020-02-10

## 2020-02-10 MED ORDER — CHLORTHALIDONE 25 MG PO TABS: ORAL_TABLET | ORAL | 3 refills | Status: DC

## 2020-02-10 NOTE — Patient Instructions (Signed)
Medication Instructions:  Your physician has recommended you make the following change in your medication:   Stop Furosemide.  *If you need a refill on your cardiac medications before your next appointment, please call your pharmacy*   Lab Work: None ordered If you have labs (blood work) drawn today and your tests are completely normal, you will receive your results only by: Marland Kitchen MyChart Message (if you have MyChart) OR . A paper copy in the mail If you have any lab test that is abnormal or we need to change your treatment, we will call you to review the results.   Testing/Procedures: None ordered   Follow-Up: At [facility], you and your health needs are our priority.  As part of our continuing mission to provide you with exceptional heart care, we have created designated Provider Care Teams.  These Care Teams include your primary Cardiologist (physician) and Advanced Practice Providers (APPs -  Physician Assistants and Nurse Practitioners) who all work together to provide you with the care you need, when you need it.  We recommend signing up for the patient portal called "MyChart".  Sign up information is provided on this After Visit Summary.  MyChart is used to connect with patients for Virtual Visits (Telemedicine).  Patients are able to view lab/test results, encounter notes, upcoming appointments, etc.  Non-urgent messages can be sent to your provider as well.   To learn more about what you can do with MyChart, go to [email or web address].    Your next appointment:   8-10 day(s)  The format for your next appointment:   In Person  Provider:   Belva Crome, MD   Other Instructions  You need to complete a device check today and call [phone] for help and when completed.

## 2020-02-10 NOTE — Addendum Note (Signed)
Addended by: Eleonore Chiquito on: 02/10/2020 12:08 PM   Modules accepted: Orders

## 2020-02-10 NOTE — Addendum Note (Signed)
Addended by: Smiley Houseman B on: 02/10/2020 02:12 PM   Modules accepted: Orders

## 2020-02-10 NOTE — Telephone Encounter (Signed)
Patient in to see cardiology today. Last remote 08/25/19. Patient will be instructed to send remote transmission and Device Clinic #  provided in AVS. Patient overdue for f/u with Dr Elberta Fortis and scheduler will contact patient to schedule with Dr Elberta Fortis in [place]. DR Revankar would like to know if patient is in need of OAC and informed transmission would be reviewed for AF and Dr Elberta Fortis will advise on [facility] at that time.

## 2020-02-10 NOTE — Telephone Encounter (Signed)
Remote transmission received and reviewed. Device function WNL, AT/AF burden < .1%,  AMS episodes with 5 EGMs that show 1 possible AF , longest episode 18 minutes 18 seconds with no EGM.Marland KitchenNo OAC.Patient contacted and reassured of no acute findings. Will forward to Dr Elberta Fortis for review.

## 2020-02-10 NOTE — Telephone Encounter (Signed)
Pt c/o medication issue:  1. Name of Medication: chlorthalidone (HYGROTON) 25 MG tablet  2. How are you currently taking this medication (dosage and times per day)? 1/2 tablet (12.5 mg) by mouth daily  3. Are you having a reaction (difficulty breathing--STAT)? No   4. What is your medication issue? Patient states, per [facility], they have not received Rx for the medication although there is an indication that the receipt has been confirmed. Are we able to resubmit it? Please return call to patient to verify.

## 2020-02-10 NOTE — Telephone Encounter (Signed)
Called patient left VM for patient to call back left DC # on voicemail

## 2020-02-10 NOTE — Progress Notes (Addendum)
Cardiology Office Note:    Date:  02/10/2020   ID:  Denise Francis, DOB 1935-11-22, MRN [id number]  PCP:  Denise Bass, MD  Cardiologist:  Denise Brothers, MD   Referring MD: Denise Bass, MD    ASSESSMENT:    1. Essential hypertension   2. Hypercholesterolemia   3. Cardiac pacemaker in situ   4. Orthostatic hypotension    PLAN:    In order of problems listed above:  1. Prevention stressed with the patient.  Importance of compliance with diet medication stressed and she vocalized understanding. 2. Essential hypertension: Patient has symptoms of orthostatic hypotension.  Therefore I will discontinue her furosemide.  She will continue other medications.  She will keep herself well-hydrated today.  She will keep a track of her blood pressures and will be seen in follow-up appointment in 8 to 10 days.  She has not had a fall. 3. History of stroke: Medical management. 4. Mixed dyslipidemia: On statin therapy and managed by primary care physician. 5. Her pacemaker evaluation at one point has revealed an AF burden of less than 0.1%.  I called our staff nurse Denise Francis and discussed this with her.  She mentions to me that the patient has missed several transmissions.  I told her to please set up for an transmission as soon as possible.  She also needs an appointment for follow-up with Dr. Elberta Francis.  I would like to know whether patient has atrial fibrillation or not for sure so we can make decisions about anticoagulation.  At any rate I am not in a rash today to put her on anticoagulation because symptoms of orthostatic hypotension and fall potential.  We will revisit this issue in 8 to 10 days.  I discussed this with her at length benefits and potential risks explained and she vocalized understanding.  She is not keen to go on anticoagulation at this time anyway. 6. Patient will be seen in follow-up appointment in 8 to 10 days or earlier if the patient has any concerns  I  messaged our EP doc Dr Denise Francis who will be seeing her in follow up and monitoring her pacer and we felt that:  she has episodes of atrial arrhythmias that look like AF, though some do not have EGMs. that being said, they are all quite short and do not require anticoagulation as the stroke risk is low with episodes that are that short. there is also data about SCAF and short episodes that says that she does not need to be anticoagulated so i have been watching it for longer episodes through her device to make a deciosn on if and when to anticoagulate.        Medication Adjustments/Labs and Tests Ordered: Current medicines are reviewed at length with the patient today.  Concerns regarding medicines are outlined above.  No orders of the defined types were placed in this encounter.  No orders of the defined types were placed in this encounter.    Chief Complaint  Patient presents with  . Follow-up  . Dizziness     History of Present Illness:    Denise Francis is a 84 y.o. female.  Patient has past medical history of essential hypertension and dyslipidemia.  She has had history of strokes.  She mentions to me that upon changing posture she develops dizziness.  She is on chlorthalidone and furosemide.  No other problems.  No chest pain orthopnea PND or palpitations.  At the time of  my evaluation, the patient is alert awake oriented and in no distress.  Past Medical History:  Diagnosis Date  . Acute cystitis without hematuria 02/15/2017  . Allergic rhinitis 04/07/2016  . Altered sensation due to recent stroke 08/25/2018   2020  . Anemia, iron deficiency 04/07/2016  . Arthropathy    nos, multiple sites with polymyalgia rheumatica  . Artificial cardiac pacemaker 09/15/2014   Overview:  Medtronic   . Asthma   . Asthma in adult, mild persistent, uncomplicated 05/21/2016  . Bradycardia 02/02/2015  . Cerebral artery occlusion with cerebral infarction (HCC) 08/01/2018  . Cervical radiculitis  03/21/2016  . Chronic pain of right knee 10/30/2012   Overview:  2017: onset 2017: ORTHO  . CKD (chronic kidney disease), stage III (HCC) 07/05/2015  . CRI (chronic renal insufficiency), stage 3 (moderate) (HCC)   . Diverticulitis of colon with hemorrhage   . Dysarthria 08/01/2018  . Essential hypertension 09/15/2014  . GERD (gastroesophageal reflux disease)   . Gout   . H/O iron deficiency anemia   . Hand discomfort 04/23/2013  . Hemiplegia affecting right dominant side (HCC) 08/25/2018   2020  . History of asthma 08/01/2018  . Hypercholesterolemia 09/15/2014  . Hyperglycemia 08/27/2017  . Hyperlipidemia   . Hyperparathyroidism due to renal insufficiency (HCC) 08/12/2015  . Hypertension   . Impaired mobility and activities of daily living 08/01/2018  . Insomnia 08/12/2015  . 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 Mixed dyslipidemia 08/20/2017  . OSA on CPAP 08/12/2015  . PMR (polymyalgia rheumatica) (HCC) 04/30/2016  . Polio 04/07/2016  . Presence of permanent cardiac pacemaker 08/20/2017  . Pulmonary hypertension (HCC) 09/15/2014  . Rectal bleeding 01/04/2017   Overview:  2018  . Recurrent major depressive disorder, in remission (HCC) 09/06/2015  . RLS (restless legs syndrome) 08/12/2015  . Screening for diabetes mellitus (DM) 03/03/2018  . Sick sinus syndrome (HCC) 08/12/2015  . Spinal stenosis of lumbar region with neurogenic claudication 01/05/2016  . Steatorrhea 05/14/2018   2020  . Stroke [facility])    Jul 27 2018  . Vitamin D deficiency 03/25/2019    Past Surgical History:  Procedure Laterality Date  . BACK SURGERY     x2  . COLONOSCOPY  08/16/2010   Attempted. Moderate-to-severe sigmoid diverticulosis. Otherwise grossly normal attempted colonoscopy up to 40 cm (The patient had very poor preparation)  . DENTAL SURGERY  12/24/2019   all teeth removed and a plate being placed in   . ESOPHAGOGASTRODUODENOSCOPY  12/20/2010   Small hiatal hernia. Mild gastritis  . KNEE SURGERY    . WRIST SURGERY       Current Medications: Current Meds  Medication Sig  . ALPRAZolam (XANAX) 0.25 MG tablet Take 0.25 mg by mouth as needed for anxiety.  Marland Kitchen atorvastatin (LIPITOR) 40 MG tablet Take 1 tablet (40 mg total) by mouth daily.  . cetirizine (ZYRTEC) 10 MG tablet Take 10 mg by mouth daily.   . chlorthalidone (HYGROTON) 25 MG tablet Take 1/2 tablet (12.5 mg) by mouth daily  / MUST KEEP APPOINTMENT FOR FUTURE REFILL  . clopidogrel (PLAVIX) 75 MG tablet Take by mouth.  . diazepam (VALIUM) 2 MG tablet Take 2 mg by mouth as needed for anxiety.   . famotidine (PEPCID) 40 MG tablet Take 40 mg by mouth daily.  Marland Kitchen FIBER PO Take by mouth. 2 teaspoon every morning with coffee  . fluticasone (FLONASE) 50 MCG/ACT nasal spray Place 2 sprays into both nostrils daily.   . furosemide (LASIX) 40 MG tablet  TAKE 1 TABLET DAILY  . gabapentin (NEURONTIN) 300 MG capsule Take 300 mg daily by mouth.  . losartan (COZAAR) 50 MG tablet Take 1 tablet (50 mg total) by mouth daily. Patient needs appointment for further refills, first warning.  . montelukast (SINGULAIR) 10 MG tablet Take 10 mg by mouth at bedtime.   . predniSONE (DELTASONE) 1 MG tablet Take 4 tablets by mouth daily.  Marland Kitchen. rOPINIRole (REQUIP) 0.25 MG tablet Take 0.5 mg by mouth daily. Take 2 tablets daily  . traMADol (ULTRAM) 50 MG tablet Take 50 mg daily as needed by mouth.     Allergies:   Amoxicillin, Clarithromycin, Flagyl [metronidazole], and Sulfamethoxazole-trimethoprim   Social History   Socioeconomic History  . Marital status: Widowed    Spouse name: Not on file  . Number of children: Not on file  . Years of education: Not on file  . Highest education level: Not on file  Occupational History  . Not on file  Tobacco Use  . Smoking status: Never Smoker  . Smokeless tobacco: Never Used  Vaping Use  . Vaping Use: Never used  Substance and Sexual Activity  . Alcohol use: Yes    Comment: rare  . Drug use: No  . Sexual activity: Not on file  Other  Topics Concern  . Not on file  Social History Narrative  . Not on file   Social Determinants of Health   Financial Resource Strain:   . Difficulty of Paying Living Expenses: Not on file  Food Insecurity:   . Worried About Programme researcher, broadcasting/film/videounning Out of Food in the Last Year: Not on file  . Ran Out of Food in the Last Year: Not on file  Transportation Needs:   . Lack of Transportation (Medical): Not on file  . Lack of Transportation (Non-Medical): Not on file  Physical Activity:   . Days of Exercise per Week: Not on file  . Minutes of Exercise per Session: Not on file  Stress:   . Feeling of Stress : Not on file  Social Connections:   . Frequency of Communication with Friends and Family: Not on file  . Frequency of Social Gatherings with Friends and Family: Not on file  . Attends Religious Services: Not on file  . Active Member of Clubs or Organizations: Not on file  . Attends BankerClub or Organization Meetings: Not on file  . Marital Status: Not on file     Family History: The patient's family history includes COPD in her mother; Heart attack in her mother; Heart failure in her father; Kidney failure in her father. There is no history of Colon cancer or Esophageal cancer.  ROS:   Please see the history of present illness.    All other systems reviewed and are negative.  EKGs/Labs/Other Studies Reviewed:    The following studies were reviewed today: EKG reveals sinus rhythm and nonspecific ST changes.   Recent Labs: No results found for requested labs within last 8760 hours.  Recent Lipid Panel No results found for: CHOL, TRIG, HDL, CHOLHDL, VLDL, LDLCALC, LDLDIRECT  Physical Exam:    VS:  BP 106/70 (BP Location: Left Arm, Patient Position: Sitting)   Pulse 66   Ht 5' 5.5" (1.664 m)   Wt 174 lb (78.9 kg)   SpO2 95%   BMI 28.51 kg/m     Wt Readings from Last 3 Encounters:  02/10/20 174 lb (78.9 kg)  12/22/19 180 lb (81.6 kg)  01/30/19 176 lb 6.4 oz (80 kg)  GEN: Patient is  in no acute distress HEENT: Normal NECK: No JVD; No carotid bruits LYMPHATICS: No lymphadenopathy CARDIAC: Hear sounds regular, 2/6 systolic murmur at the apex. RESPIRATORY:  Clear to auscultation without rales, wheezing or rhonchi  ABDOMEN: Soft, non-tender, non-distended MUSCULOSKELETAL:  No edema; No deformity  SKIN: Warm and dry NEUROLOGIC:  Alert and oriented x 3 PSYCHIATRIC:  Normal affect   Signed, Denise Brothers, MD  02/10/2020 11:38 AM    [facility]

## 2020-02-10 NOTE — Telephone Encounter (Signed)
Pt is aware that the RX was sent again and Prevo was called and clarified that the RX was received.

## 2020-02-10 NOTE — Telephone Encounter (Signed)
Contact to confirm remote transmission sent.

## 2020-02-10 NOTE — Telephone Encounter (Signed)
Patient has called back and I walked her through to send a transmission and it was successful. Patient would like a call back today before the holiday break

## 2020-02-12 LAB — CUP PACEART REMOTE DEVICE CHECK
Battery Impedance: 927 Ohm
Battery Remaining Longevity: 69 mo
Battery Voltage: 2.78 V
Brady Statistic AP VP Percent: 4 %
Brady Statistic AP VS Percent: 82 %
Brady Statistic AS VP Percent: 0 %
Brady Statistic AS VS Percent: 13 %
Date Time Interrogation Session: 20211124152548
Implantable Lead Implant Date: 20131107
Implantable Lead Implant Date: 20131107
Implantable Lead Location: 753859
Implantable Lead Location: 753860
Implantable Lead Model: 4092
Implantable Lead Model: 5076
Implantable Pulse Generator Implant Date: 20131107
Lead Channel Impedance Value: 443 Ohm
Lead Channel Impedance Value: 699 Ohm
Lead Channel Pacing Threshold Amplitude: 0.5 V
Lead Channel Pacing Threshold Amplitude: 0.625 V
Lead Channel Pacing Threshold Pulse Width: 0.4 ms
Lead Channel Pacing Threshold Pulse Width: 0.4 ms
Lead Channel Setting Pacing Amplitude: 1.5 V
Lead Channel Setting Pacing Amplitude: 2 V
Lead Channel Setting Pacing Pulse Width: 0.4 ms
Lead Channel Setting Sensing Sensitivity: 1.4 mV

## 2020-02-17 NOTE — Telephone Encounter (Signed)
Patient has f/u with Dr Elberta Fortis 02/22/20.

## 2020-02-19 ENCOUNTER — Ambulatory Visit: Payer: Medicare Other | Admitting: Cardiology

## 2020-02-22 ENCOUNTER — Other Ambulatory Visit: Payer: Self-pay

## 2020-02-22 ENCOUNTER — Encounter: Payer: Self-pay | Admitting: Cardiology

## 2020-02-22 ENCOUNTER — Ambulatory Visit (INDEPENDENT_AMBULATORY_CARE_PROVIDER_SITE_OTHER): Payer: Medicare Other | Admitting: Cardiology

## 2020-02-22 VITALS — BP 116/86 | HR 78 | Ht 65.0 in | Wt 171.4 lb

## 2020-02-22 DIAGNOSIS — I495 Sick sinus syndrome: Secondary | ICD-10-CM | POA: Diagnosis not present

## 2020-02-22 LAB — CUP PACEART INCLINIC DEVICE CHECK
Battery Impedance: 953 Ohm
Battery Remaining Longevity: 68 mo
Battery Voltage: 2.77 V
Brady Statistic AP VP Percent: 4 %
Brady Statistic AP VS Percent: 82 %
Brady Statistic AS VP Percent: 0 %
Brady Statistic AS VS Percent: 13 %
Date Time Interrogation Session: 20211206132200
Implantable Lead Implant Date: 20131107
Implantable Lead Implant Date: 20131107
Implantable Lead Location: 753859
Implantable Lead Location: 753860
Implantable Lead Model: 4092
Implantable Lead Model: 5076
Implantable Pulse Generator Implant Date: 20131107
Lead Channel Impedance Value: 438 Ohm
Lead Channel Impedance Value: 666 Ohm
Lead Channel Pacing Threshold Amplitude: 0.5 V
Lead Channel Pacing Threshold Amplitude: 0.5 V
Lead Channel Pacing Threshold Pulse Width: 0.4 ms
Lead Channel Pacing Threshold Pulse Width: 0.4 ms
Lead Channel Sensing Intrinsic Amplitude: 4 mV
Lead Channel Sensing Intrinsic Amplitude: 5.6 mV
Lead Channel Setting Pacing Amplitude: 1.5 V
Lead Channel Setting Pacing Amplitude: 2 V
Lead Channel Setting Pacing Pulse Width: 0.4 ms
Lead Channel Setting Sensing Sensitivity: 1.4 mV

## 2020-02-22 NOTE — Patient Instructions (Signed)
Medication Instructions:  Your physician recommends that you continue on your current medications as directed. Please refer to the Current Medication list given to you today.  *If you need a refill on your cardiac medications before your next appointment, please call your pharmacy*   Lab Work: None ordered If you have labs (blood work) drawn today and your tests are completely normal, you will receive your results only by: Marland Kitchen MyChart Message (if you have MyChart) OR . A paper copy in the mail If you have any lab test that is abnormal or we need to change your treatment, we will call you to review the results.   Testing/Procedures: None ordered   Follow-Up: At [facility], you and your health needs are our priority.  As part of our continuing mission to provide you with exceptional heart care, we have created designated Provider Care Teams.  These Care Teams include your primary Cardiologist (physician) and Advanced Practice Providers (APPs -  Physician Assistants and Nurse Practitioners) who all work together to provide you with the care you need, when you need it.  We recommend signing up for the patient portal called "MyChart".  Sign up information is provided on this After Visit Summary.  MyChart is used to connect with patients for Virtual Visits (Telemedicine).  Patients are able to view lab/test results, encounter notes, upcoming appointments, etc.  Non-urgent messages can be sent to your provider as well.   To learn more about what you can do with MyChart, go to [email or web address].    Remote monitoring is used to monitor your Pacemaker or ICD from home. This monitoring reduces the number of office visits required to check your device to one time per year. It allows [place] to keep an eye on the functioning of your device to ensure it is working properly. You are scheduled for a device check from home on 05/13/20. You may send your transmission at any time that day. If you have a  wireless device, the transmission will be sent automatically. After your physician reviews your transmission, you will receive a postcard with your next transmission date.  Your next appointment:   1 year(s)  The format for your next appointment:   In Person  Provider:   Loman Brooklyn, MD   Thank you for choosing [facility]!!   Dory Horn, RN [phone]    Other Instructions

## 2020-02-22 NOTE — Progress Notes (Signed)
Electrophysiology Office Note   Date:  02/22/2020   ID:  CAPRI VEALS, DOB 01/13/1936, MRN [id number]  PCP:  Olive Bass, MD  Cardiologist:  Revankar Primary Electrophysiologist:  Kaylen Nghiem Jorja Loa, MD    No chief complaint on file.    History of Present Illness: MASEN SALVAS is a 84 y.o. female who is being seen today for the evaluation of pacemaker at the request of Glean Hess Revankar. Presenting today for electrophysiology evaluation.    She has a history significant for hypertension, pulmonary hypertension, polymyalgia rheumatica, and sick sinus syndrome status post Medtronic dual-chamber pacemaker.  Today, denies symptoms of palpitations, chest pain, shortness of breath, orthopnea, PND, lower extremity edema, claudication, dizziness, presyncope, syncope, bleeding, or neurologic sequela. The patient is tolerating medications without difficulties.  She is overall been doing well.  She has no chest pain or shortness of breath.  She is able to do all of her daily activities.  She has been dizzy, which has been worked up by her primary cardiologist.  Her dizziness mainly occurs in the morning.  Device interrogation shows atrial arrhythmias, but none in the morning, most of which are in the afternoon to early evening.  Past Medical History:  Diagnosis Date  . Acquired unequal limb length 09/04/2018  . Acute cystitis without hematuria 02/15/2017  . Acute right ankle pain 10/23/2018   Formatting of this note might be different from the original. 2020  . Allergic rhinitis 04/07/2016  . Altered gait 09/04/2018  . Altered sensation due to recent stroke 08/25/2018   2020  . Anemia, iron deficiency 04/07/2016  . Arthritis 09/27/2015  . Arthropathy    nos, multiple sites with polymyalgia rheumatica  . Artificial cardiac pacemaker 09/15/2014   Overview:  Medtronic   . Asthma   . Asthma in adult, mild persistent, uncomplicated 05/21/2016  . Bradycardia 02/02/2015  . Cardiac pacemaker  in situ 02/10/2020  . Cerebral artery occlusion with cerebral infarction (HCC) 08/01/2018  . Cervical radiculitis 03/21/2016  . Chronic pain of right knee 10/30/2012   Overview:  2017: onset 2017: ORTHO  . CKD (chronic kidney disease), stage III (HCC) 07/05/2015  . CRI (chronic renal insufficiency), stage 3 (moderate) (HCC)   . Diarrhea 12/11/2018   Formatting of this note might be different from the original. 2020  . Diverticulitis of colon with hemorrhage   . Dysarthria 08/01/2018  . Edema of right lower leg 10/23/2018   Formatting of this note might be different from the original. 2020  . Essential hypertension 09/15/2014  . GERD (gastroesophageal reflux disease)   . Gout   . H/O iron deficiency anemia   . Hand discomfort 04/23/2013  . Hemiplegia affecting right dominant side (HCC) 08/25/2018   2020  . History of asthma 08/01/2018  . Hypercholesterolemia 09/15/2014  . Hyperglycemia 08/27/2017  . Hyperlipidemia   . Hyperparathyroidism due to renal insufficiency (HCC) 08/12/2015  . Hypertension   . Impaired mobility and activities of daily living 08/01/2018  . Insomnia 08/12/2015  . 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 Mixed dyslipidemia 08/20/2017  . Moderate single current episode of major depressive disorder (HCC) 09/06/2015   Formatting of this note might be different from the original. 2017:  . Orthostatic hypotension 02/10/2020  . OSA on CPAP 08/12/2015  . Osteoporosis 08/12/2015  . Pain in the groin, left 01/09/2018   2019  . Pain of both wrist joints 04/17/2016   Formatting of this note might be different from the original. 2017: onset 2017:  ORTHO  . PMR (polymyalgia rheumatica) (HCC) 04/30/2016  . Polio 04/07/2016  . Prediabetes 08/27/2017   Formatting of this note might be different from the original. 2020: 110/6.3  . Presence of permanent cardiac pacemaker 08/20/2017  . Pulmonary hypertension (HCC) 09/15/2014  . Rectal bleeding 01/04/2017   Overview:  2018  . Recurrent major depressive  disorder, in remission (HCC) 09/06/2015  . RLS (restless legs syndrome) 08/12/2015  . Screening for diabetes mellitus (DM) 03/03/2018  . Sick sinus syndrome (HCC) 08/12/2015  . Spinal stenosis of lumbar region with neurogenic claudication 01/05/2016  . Steatorrhea 05/14/2018   2020  . Stroke [facility])    Jul 27 2018  . Vitamin D deficiency 03/25/2019   Past Surgical History:  Procedure Laterality Date  . BACK SURGERY     x2  . COLONOSCOPY  08/16/2010   Attempted. Moderate-to-severe sigmoid diverticulosis. Otherwise grossly normal attempted colonoscopy up to 40 cm (The patient had very poor preparation)  . DENTAL SURGERY  12/24/2019   all teeth removed and a plate being placed in   . ESOPHAGOGASTRODUODENOSCOPY  12/20/2010   Small hiatal hernia. Mild gastritis  . KNEE SURGERY    . WRIST SURGERY       Current Outpatient Medications  Medication Sig Dispense Refill  . ALPRAZolam (XANAX) 0.25 MG tablet Take 0.25 mg by mouth as needed for anxiety.    Marland Kitchen atorvastatin (LIPITOR) 40 MG tablet Take 1 tablet (40 mg total) by mouth daily. 30 tablet 3  . cetirizine (ZYRTEC) 10 MG tablet Take 10 mg by mouth daily.     . chlorthalidone (HYGROTON) 25 MG tablet Take 1/2 tablet (12.5 mg) by mouth daily. 45 tablet 3  . clopidogrel (PLAVIX) 75 MG tablet Take by mouth.    . diazepam (VALIUM) 2 MG tablet Take 2 mg by mouth as needed for anxiety.     Marland Kitchen FIBER PO Take by mouth. 2 teaspoon every morning with coffee    . fluticasone (FLONASE) 50 MCG/ACT nasal spray Place 2 sprays into both nostrils daily.     Marland Kitchen gabapentin (NEURONTIN) 300 MG capsule Take 300 mg daily by mouth.    . hydrALAZINE (APRESOLINE) 25 MG tablet Take 25 mg by mouth as needed (as needed for systolic blood pressure >160 when you check in the morning and evening).     Marland Kitchen losartan (COZAAR) 50 MG tablet Take 1 tablet (50 mg total) by mouth daily. Patient needs appointment for further refills, first warning. 90 tablet 0  . montelukast (SINGULAIR) 10 MG  tablet Take 10 mg by mouth at bedtime.     Marland Kitchen omeprazole (PRILOSEC) 20 MG capsule Take 20 mg by mouth daily.     . predniSONE (DELTASONE) 1 MG tablet Take 4 tablets by mouth daily.    Marland Kitchen rOPINIRole (REQUIP) 0.25 MG tablet Take 0.5 mg by mouth daily. Take 2 tablets daily    . traMADol (ULTRAM) 50 MG tablet Take 50 mg daily as needed by mouth.     No current facility-administered medications for this visit.    Allergies:   Amoxicillin, Clarithromycin, Flagyl [metronidazole], and Sulfamethoxazole-trimethoprim   Social History:  The patient  reports that she has never smoked. She has never used smokeless tobacco. She reports current alcohol use. She reports that she does not use drugs.   Family History:  The patient's family history includes COPD in her mother; Heart attack in her mother; Heart failure in her father; Kidney failure in her father.    ROS:  Please see the history of present illness.   Otherwise, review of systems is positive for none.   All other systems are reviewed and negative.   PHYSICAL EXAM: VS:  BP 116/86   Pulse 78   Ht 5\' 5"  (1.651 m)   Wt 171 lb 6.4 oz (77.7 kg)   SpO2 96%   BMI 28.52 kg/m  , BMI Body mass index is 28.52 kg/m. GEN: Well nourished, well developed, in no acute distress  HEENT: normal  Neck: no JVD, carotid bruits, or masses Cardiac: RRR; no murmurs, rubs, or gallops,no edema  Respiratory:  clear to auscultation bilaterally, normal work of breathing GI: soft, nontender, nondistended, + BS MS: no deformity or atrophy  Skin: warm and dry, device site well healed Neuro:  Strength and sensation are intact Psych: euthymic mood, full affect  EKG:  EKG is not ordered today. Personal review of the ekg ordered 02/10/20 shows atrial paced, prolonged AV conduction  Personal review of the device interrogation today. Results in Paceart    Recent Labs: No results found for requested labs within last 8760 hours.    Lipid Panel  No results found for:  CHOL, TRIG, HDL, CHOLHDL, VLDL, LDLCALC, LDLDIRECT   Wt Readings from Last 3 Encounters:  02/22/20 171 lb 6.4 oz (77.7 kg)  02/10/20 174 lb (78.9 kg)  12/22/19 180 lb (81.6 kg)      Other studies Reviewed: Additional studies/ records that were reviewed today include: TTE 07/27/2018 Borderline concentric LVH Ejection fraction 65 to 70% Impaired diastolic relaxation Normal right ventricular function Mildly dilated left atrium Mild pulmonary hypertension  ASSESSMENT AND PLAN:  1.  Sick sinus syndrome: Status post Medtronic dual-chamber pacemaker.  Device functioning appropriately.  No changes.  2.  Hypertension: Currently well controlled  3.  Obstructive sleep apnea: CPAP compliance encouraged  4.  Atrial arrhythmias: Patient has had multiple atrial arrhythmias, fortunately all have been short-lived.  She has had 1 episode of atrial rates in the 400s which lasted for 18 minutes.  This appears to be due to atrial fibrillation.  If she has longer episodes, anticoagulation would be beneficial.  Current medicines are reviewed at length with the patient today.   The patient does not have concerns regarding her medicines.  The following changes were made today: none  Labs/ tests ordered today include:  No orders of the defined types were placed in this encounter.    Disposition:   FU with Anndee Connett 12 months  Signed, Pope Brunty 09/26/2018, MD  02/22/2020 12:39 PM     [facility] [address] [place] [phone] (office) [phone] (fax)

## 2020-02-25 ENCOUNTER — Encounter: Payer: Self-pay | Admitting: Cardiology

## 2020-02-25 ENCOUNTER — Other Ambulatory Visit: Payer: Self-pay

## 2020-02-25 ENCOUNTER — Ambulatory Visit (INDEPENDENT_AMBULATORY_CARE_PROVIDER_SITE_OTHER): Payer: Medicare Other | Admitting: Cardiology

## 2020-02-25 VITALS — BP 128/80 | HR 94 | Ht 66.0 in | Wt 172.6 lb

## 2020-02-25 DIAGNOSIS — I480 Paroxysmal atrial fibrillation: Secondary | ICD-10-CM

## 2020-02-25 DIAGNOSIS — E7800 Pure hypercholesterolemia, unspecified: Secondary | ICD-10-CM | POA: Diagnosis not present

## 2020-02-25 DIAGNOSIS — I10 Essential (primary) hypertension: Secondary | ICD-10-CM

## 2020-02-25 DIAGNOSIS — Z950 Presence of cardiac pacemaker: Secondary | ICD-10-CM | POA: Diagnosis not present

## 2020-02-25 HISTORY — DX: Paroxysmal atrial fibrillation: I48.0

## 2020-02-25 NOTE — Progress Notes (Signed)
Cardiology Office Note:    Date:  02/25/2020   ID:  [name], DOB 23-Jun-1935, MRN [id number]  PCP:  Olive Bass, MD  Cardiologist:  Garwin Brothers, MD   Referring MD: Olive Bass, MD    ASSESSMENT:    1. Essential hypertension   2. PAF (paroxysmal atrial fibrillation) (HCC)   3. Hypercholesterolemia   4. Presence of permanent cardiac pacemaker    PLAN:    In order of problems listed above:  1. Primary prevention stressed with the patient.  Importance of compliance with diet medication stressed and she vocalized understanding. 2. Atrial arrhythmias: Very brief possible episodes of atrial fibrillation: Medical management per our electrophysiology colleague.  I discussed this with her at length.  Patient does not want to be on anticoagulation.  Benefits and risks explained.  At this time she wants to continue her clopidogrel.  I told her that that is not very beneficial for atrial fibrillation anticoagulation and she understands.  Risks explained and we will revisit this in 3 months when she comes for follow-up.  At that time also we will do a pacemaker evaluation to make sure that this is monitored closely. 3. Essential hypertension: Blood pressure stable and diet was emphasized.  I told her to walk on a regular basis and she promises to do so. 4. Renal insufficiency: Managed by nephrologist and primary care.  Patient had multiple questions which were answered to satisfaction.  I reviewed electrophysiology notes extensively.   Medication Adjustments/Labs and Tests Ordered: Current medicines are reviewed at length with the patient today.  Concerns regarding medicines are outlined above.  No orders of the defined types were placed in this encounter.  No orders of the defined types were placed in this encounter.    No chief complaint on file.    History of Present Illness:    Denise Francis is a 84 y.o. female.  Patient has past medical history of essential  hypertension dyslipidemia renal insufficiency and what appears to be very small episodes of atrial fibrillation on the monitor.  I discussed this with our electrophysiologist Dr. Elberta Fortis and consulted the patient with him.  He reviewed all these tracings and pacemaker monitorings and felt that we could hold off on anticoagulation and monitor closely.  I discussed this with her at length.  Past Medical History:  Diagnosis Date  . Acquired unequal limb length 09/04/2018  . Acute cystitis without hematuria 02/15/2017  . Acute right ankle pain 10/23/2018   Formatting of this note might be different from the original. 2020  . Allergic rhinitis 04/07/2016  . Altered gait 09/04/2018  . Altered sensation due to recent stroke 08/25/2018   2020  . Anemia, iron deficiency 04/07/2016  . Arthritis 09/27/2015  . Arthropathy    nos, multiple sites with polymyalgia rheumatica  . Artificial cardiac pacemaker 09/15/2014   Overview:  [employer]   . Asthma   . Asthma in adult, mild persistent, uncomplicated 05/21/2016  . Bradycardia 02/02/2015  . Cardiac pacemaker in situ 02/10/2020  . Cerebral artery occlusion with cerebral infarction (HCC) 08/01/2018  . Cervical radiculitis 03/21/2016  . Chronic pain of right knee 10/30/2012   Overview:  2017: onset 2017: ORTHO  . CKD (chronic kidney disease), stage III (HCC) 07/05/2015  . CRI (chronic renal insufficiency), stage 3 (moderate) (HCC)   . Diarrhea 12/11/2018   Formatting of this note might be different from the original. 2020  . Diverticulitis of colon with hemorrhage   .  Dysarthria 08/01/2018  . Edema of right lower leg 10/23/2018   Formatting of this note might be different from the original. 2020  . Essential hypertension 09/15/2014  . GERD (gastroesophageal reflux disease)   . Gout   . H/O iron deficiency anemia   . Hand discomfort 04/23/2013  . Hemiplegia affecting right dominant side (HCC) 08/25/2018   2020  . History of asthma 08/01/2018  . Hypercholesterolemia  09/15/2014  . Hyperglycemia 08/27/2017  . Hyperlipidemia   . Hyperparathyroidism due to renal insufficiency (HCC) 08/12/2015  . Hypertension   . Impaired mobility and activities of daily living 08/01/2018  . Insomnia 08/12/2015  . 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 Mixed dyslipidemia 08/20/2017  . Moderate single current episode of major depressive disorder (HCC) 09/06/2015   Formatting of this note might be different from the original. 2017:  . Orthostatic hypotension 02/10/2020  . OSA on CPAP 08/12/2015  . Osteoporosis 08/12/2015  . Pain in the groin, left 01/09/2018   2019  . Pain of both wrist joints 04/17/2016   Formatting of this note might be different from the original. 2017: onset 2017: ORTHO  . PMR (polymyalgia rheumatica) (HCC) 04/30/2016  . Polio 04/07/2016  . Prediabetes 08/27/2017   Formatting of this note might be different from the original. 2020: 110/6.3  . Presence of permanent cardiac pacemaker 08/20/2017  . Pulmonary hypertension (HCC) 09/15/2014  . Rectal bleeding 01/04/2017   Overview:  2018  . Recurrent major depressive disorder, in remission (HCC) 09/06/2015  . RLS (restless legs syndrome) 08/12/2015  . Screening for diabetes mellitus (DM) 03/03/2018  . Sick sinus syndrome (HCC) 08/12/2015  . Spinal stenosis of lumbar region with neurogenic claudication 01/05/2016  . Steatorrhea 05/14/2018   2020  . Stroke [facility])    Jul 27 2018  . Vitamin D deficiency 03/25/2019    Past Surgical History:  Procedure Laterality Date  . BACK SURGERY     x2  . COLONOSCOPY  08/16/2010   Attempted. Moderate-to-severe sigmoid diverticulosis. Otherwise grossly normal attempted colonoscopy up to 40 cm (The patient had very poor preparation)  . DENTAL SURGERY  12/24/2019   all teeth removed and a plate being placed in   . ESOPHAGOGASTRODUODENOSCOPY  12/20/2010   Small hiatal hernia. Mild gastritis  . KNEE SURGERY    . WRIST SURGERY      Current Medications: Current Meds  Medication Sig  .  ALPRAZolam (XANAX) 0.25 MG tablet Take 0.25 mg by mouth as needed for anxiety.  Marland Kitchen atorvastatin (LIPITOR) 40 MG tablet Take 1 tablet (40 mg total) by mouth daily.  . cetirizine (ZYRTEC) 10 MG tablet Take 10 mg by mouth daily.   . chlorthalidone (HYGROTON) 25 MG tablet Take 1/2 tablet (12.5 mg) by mouth daily.  . clopidogrel (PLAVIX) 75 MG tablet Take by mouth.  . diazepam (VALIUM) 2 MG tablet Take 2 mg by mouth as needed for anxiety.   Marland Kitchen FIBER PO Take by mouth. 2 teaspoon every morning with coffee  . fluticasone (FLONASE) 50 MCG/ACT nasal spray Place 2 sprays into both nostrils daily.   Marland Kitchen gabapentin (NEURONTIN) 300 MG capsule Take 300 mg daily by mouth.  . hydrALAZINE (APRESOLINE) 25 MG tablet Take 25 mg by mouth as needed (as needed for systolic blood pressure >160 when you check in the morning and evening).   Marland Kitchen losartan (COZAAR) 50 MG tablet Take 1 tablet (50 mg total) by mouth daily. Patient needs appointment for further refills, first warning.  . montelukast (SINGULAIR) 10  MG tablet Take 10 mg by mouth at bedtime.   Marland Kitchen omeprazole (PRILOSEC) 20 MG capsule Take 20 mg by mouth daily.   . predniSONE (DELTASONE) 1 MG tablet Take 4 tablets by mouth daily.  Marland Kitchen rOPINIRole (REQUIP) 0.25 MG tablet Take 0.5 mg by mouth daily. Take 2 tablets daily  . traMADol (ULTRAM) 50 MG tablet Take 50 mg daily as needed by mouth.     Allergies:   Amoxicillin, Clarithromycin, Flagyl [metronidazole], and Sulfamethoxazole-trimethoprim   Social History   Socioeconomic History  . Marital status: Widowed    Spouse name: Not on file  . Number of children: Not on file  . Years of education: Not on file  . Highest education level: Not on file  Occupational History  . Not on file  Tobacco Use  . Smoking status: Never Smoker  . Smokeless tobacco: Never Used  Vaping Use  . Vaping Use: Never used  Substance and Sexual Activity  . Alcohol use: Yes    Comment: rare  . Drug use: No  . Sexual activity: Not on file   Other Topics Concern  . Not on file  Social History Narrative  . Not on file   Social Determinants of Health   Financial Resource Strain: Not on file  Food Insecurity: Not on file  Transportation Needs: Not on file  Physical Activity: Not on file  Stress: Not on file  Social Connections: Not on file     Family History: The patient's family history includes COPD in her mother; Heart attack in her mother; Heart failure in her father; Kidney failure in her father. There is no history of Colon cancer or Esophageal cancer.  ROS:   Please see the history of present illness.    All other systems reviewed and are negative.  EKGs/Labs/Other Studies Reviewed:    The following studies were reviewed today: I discussed my findings with the patient extensively.   Recent Labs: No results found for requested labs within last 8760 hours.  Recent Lipid Panel No results found for: CHOL, TRIG, HDL, CHOLHDL, VLDL, LDLCALC, LDLDIRECT  Physical Exam:    VS:  BP 128/80   Pulse 94   Ht 5\' 6"  (1.676 m)   Wt 172 lb 9.6 oz (78.3 kg)   SpO2 96%   BMI 27.86 kg/m     Wt Readings from Last 3 Encounters:  02/25/20 172 lb 9.6 oz (78.3 kg)  02/22/20 171 lb 6.4 oz (77.7 kg)  02/10/20 174 lb (78.9 kg)     GEN: Patient is in no acute distress HEENT: Normal NECK: No JVD; No carotid bruits LYMPHATICS: No lymphadenopathy CARDIAC: Hear sounds regular, 2/6 systolic murmur at the apex. RESPIRATORY:  Clear to auscultation without rales, wheezing or rhonchi  ABDOMEN: Soft, non-tender, non-distended MUSCULOSKELETAL:  No edema; No deformity  SKIN: Warm and dry NEUROLOGIC:  Alert and oriented x 3 PSYCHIATRIC:  Normal affect   Signed, 02/12/20, MD  02/25/2020 3:56 PM    [facility]

## 2020-02-25 NOTE — Patient Instructions (Signed)

## 2020-03-11 DIAGNOSIS — K5733 Diverticulitis of large intestine without perforation or abscess with bleeding: Secondary | ICD-10-CM | POA: Insufficient documentation

## 2020-03-14 DIAGNOSIS — R399 Unspecified symptoms and signs involving the genitourinary system: Secondary | ICD-10-CM

## 2020-03-14 HISTORY — DX: Unspecified symptoms and signs involving the genitourinary system: R39.9

## 2020-03-16 ENCOUNTER — Telehealth: Payer: Self-pay | Admitting: Cardiology

## 2020-03-16 NOTE — Telephone Encounter (Signed)
Returned call to Pt.  Discussed that per office visit from November Pt had furosemide discontinued.  Appears the next visit she stated she was not taking famotidine.    Pt states she thinks she confused the furosemide and famotidine.  She IS taking famotidine (added back to med list)  She is NOT taking prilosec (removed from med list)  And we confirmed together that she is not to take furosemide.  Pt thanked nurse for call back.

## 2020-03-16 NOTE — Telephone Encounter (Signed)
Patient is calling stating that at her last appointment she was taken off either Furosemide 40 mg or Famotidine 40 mg but doesn't remember which one she should stop taking. Please advise.

## 2020-03-21 DIAGNOSIS — Z20822 Contact with and (suspected) exposure to covid-19: Secondary | ICD-10-CM

## 2020-03-21 HISTORY — DX: Contact with and (suspected) exposure to covid-19: Z20.822

## 2020-05-07 ENCOUNTER — Other Ambulatory Visit: Payer: Self-pay | Admitting: Cardiology

## 2020-05-12 ENCOUNTER — Telehealth: Payer: Self-pay

## 2020-05-12 NOTE — Telephone Encounter (Signed)
PA done on CMM for Chlorthalidone 25 mg. Key [id number]. PA not needed per [facility] drug is covered by patients current drug plan.

## 2020-06-13 ENCOUNTER — Other Ambulatory Visit: Payer: Self-pay

## 2020-06-14 ENCOUNTER — Encounter: Payer: Self-pay | Admitting: Cardiology

## 2020-06-14 ENCOUNTER — Ambulatory Visit (INDEPENDENT_AMBULATORY_CARE_PROVIDER_SITE_OTHER): Payer: Medicare Other | Admitting: Cardiology

## 2020-06-14 ENCOUNTER — Other Ambulatory Visit: Payer: Self-pay

## 2020-06-14 VITALS — BP 126/72 | HR 86 | Ht 65.6 in | Wt 174.6 lb

## 2020-06-14 DIAGNOSIS — I10 Essential (primary) hypertension: Secondary | ICD-10-CM | POA: Diagnosis not present

## 2020-06-14 DIAGNOSIS — I480 Paroxysmal atrial fibrillation: Secondary | ICD-10-CM | POA: Diagnosis not present

## 2020-06-14 DIAGNOSIS — E7800 Pure hypercholesterolemia, unspecified: Secondary | ICD-10-CM

## 2020-06-14 DIAGNOSIS — Z950 Presence of cardiac pacemaker: Secondary | ICD-10-CM | POA: Diagnosis not present

## 2020-06-14 NOTE — Progress Notes (Signed)
Cardiology Office Note:    Date:  06/14/2020   ID:  Denise Francis, DOB 04-23-1935, MRN [id number]  PCP:  Olive Bass, MD  Cardiologist:  Garwin Brothers, MD   Referring MD: Olive Bass, MD    ASSESSMENT:    1. Essential hypertension   2. Hypercholesterolemia   3. PAF (paroxysmal atrial fibrillation) (HCC)   4. Cardiac pacemaker in situ    PLAN:    In order of problems listed above:  1. Primary prevention stressed with the patient.  Importance of compliance with diet medication stressed and she vocalized understanding. 2. Atrial arrhythmias: Possible atrial fibrillation.  This is managed by our electrophysiology colleagues.  Patient is not keen on anticoagulation and risks including stroke explained and she understands.  She also has issues with ambulation because of orthopedic issues and her gait may not be stable. 3. Essential hypertension: Blood pressure is borderline.  Patient gives history suggestive of orthostatic hypotension and have asked her to hold her losartan and tell us how she feels in a week.  She also send [place] blood pressure readings in a week. 4. Renal insufficiency: Stable at this time and managed by nephrologist. 5. Patient will be seen in follow-up appointment in 6 months or earlier if the patient has any concerns    Medication Adjustments/Labs and Tests Ordered: Current medicines are reviewed at length with the patient today.  Concerns regarding medicines are outlined above.  No orders of the defined types were placed in this encounter.  No orders of the defined types were placed in this encounter.    No chief complaint on file.    History of Present Illness:    Denise Francis is a 85 y.o. female.  Patient has past medical history of paroxysmal atrial fibrillation, essential hypertension and renal insufficiency.  She denies any problems at this time and takes care of activities of daily living.  She has a permanent pacemaker.  No chest  pain orthopnea or PND.  At the time of my evaluation, the patient is alert awake oriented and in no distress.  She leads a sedentary lifestyle because of orthopedic issues.  Past Medical History:  Diagnosis Date  . Acquired unequal limb length 09/04/2018  . Acute cystitis without hematuria 02/15/2017  . Acute right ankle pain 10/23/2018   Formatting of this note might be different from the original. 2020  . Allergic rhinitis 04/07/2016  . Altered gait 09/04/2018  . Altered sensation due to recent stroke 08/25/2018   2020  . Anemia, iron deficiency 04/07/2016  . Arthritis 09/27/2015  . Arthropathy    nos, multiple sites with polymyalgia rheumatica  . Artificial cardiac pacemaker 09/15/2014   Overview:  [employer]   . Asthma   . Asthma in adult, mild persistent, uncomplicated 05/21/2016  . Bradycardia 02/02/2015  . Cardiac pacemaker in situ 02/10/2020  . Cerebral artery occlusion with cerebral infarction (HCC) 08/01/2018  . Cervical radiculitis 03/21/2016  . Chronic pain of right knee 10/30/2012   Overview:  2017: onset 2017: ORTHO  . CKD (chronic kidney disease), stage III (HCC) 07/05/2015  . CRI (chronic renal insufficiency), stage 3 (moderate) (HCC)   . Diarrhea 12/11/2018   Formatting of this note might be different from the original. 2020  . Diverticulitis of colon with hemorrhage   . Dysarthria 08/01/2018  . Edema of right lower leg 10/23/2018   Formatting of this note might be different from the original. 2020  . Essential hypertension 09/15/2014  .  GERD (gastroesophageal reflux disease)   . Gout   . H/O iron deficiency anemia   . Hand discomfort 04/23/2013  . Hemiplegia affecting right dominant side (HCC) 08/25/2018   2020  . History of asthma 08/01/2018  . Hypercholesterolemia 09/15/2014  . Hyperglycemia 08/27/2017  . Hyperlipidemia   . Hyperparathyroidism due to renal insufficiency (HCC) 08/12/2015  . Hypertension   . Impaired mobility and activities of daily living 08/01/2018  . Insomnia  08/12/2015  . 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 Mixed dyslipidemia 08/20/2017  . Moderate single current episode of major depressive disorder (HCC) 09/06/2015   Formatting of this note might be different from the original. 2017:  . Orthostatic hypotension 02/10/2020  . OSA on CPAP 08/12/2015  . Osteoporosis 08/12/2015  . PAF (paroxysmal atrial fibrillation) (HCC) 02/25/2020  . Pain in the groin, left 01/09/2018   2019  . Pain of both wrist joints 04/17/2016   Formatting of this note might be different from the original. 2017: onset 2017: ORTHO  . PMR (polymyalgia rheumatica) (HCC) 04/30/2016  . Polio 04/07/2016  . Prediabetes 08/27/2017   Formatting of this note might be different from the original. 2020: 110/6.3  . Presence of permanent cardiac pacemaker 08/20/2017  . Pulmonary hypertension (HCC) 09/15/2014  . Rectal bleeding 01/04/2017   Overview:  2018  . Recurrent major depressive disorder, in remission (HCC) 09/06/2015  . RLS (restless legs syndrome) 08/12/2015  . Screening for diabetes mellitus (DM) 03/03/2018  . Sick sinus syndrome (HCC) 08/12/2015  . Spinal stenosis of lumbar region with neurogenic claudication 01/05/2016  . Steatorrhea 05/14/2018   2020  . Stroke [facility])    Jul 27 2018  . Suspected COVID-19 virus infection 03/21/2020   Formatting of this note might be different from the original. 03/21/2020  . UTI symptoms 03/14/2020  . Vitamin D deficiency 03/25/2019    Past Surgical History:  Procedure Laterality Date  . BACK SURGERY     x2  . COLONOSCOPY  08/16/2010   Attempted. Moderate-to-severe sigmoid diverticulosis. Otherwise grossly normal attempted colonoscopy up to 40 cm (The patient had very poor preparation)  . DENTAL SURGERY  12/24/2019   all teeth removed and a plate being placed in   . ESOPHAGOGASTRODUODENOSCOPY  12/20/2010   Small hiatal hernia. Mild gastritis  . KNEE SURGERY    . WRIST SURGERY      Current Medications: Current Meds  Medication Sig  . atorvastatin  (LIPITOR) 40 MG tablet Take 1 tablet (40 mg total) by mouth daily.  . cetirizine (ZYRTEC) 10 MG tablet Take 10 mg by mouth daily.   . chlorthalidone (HYGROTON) 25 MG tablet Take 12.5 mg by mouth daily.  . clopidogrel (PLAVIX) 75 MG tablet Take 75 mg by mouth daily.  Marland Kitchen ezetimibe (ZETIA) 10 MG tablet Take 10 mg by mouth daily.  . famotidine (PEPCID) 40 MG tablet Take 40 mg by mouth daily.  Marland Kitchen FIBER PO Take by mouth. 2 teaspoon every morning with coffee  . fluticasone (FLONASE) 50 MCG/ACT nasal spray Place 2 sprays into both nostrils daily.   Marland Kitchen gabapentin (NEURONTIN) 300 MG capsule Take 300 mg daily by mouth.  . montelukast (SINGULAIR) 10 MG tablet Take 10 mg by mouth at bedtime.   . predniSONE (DELTASONE) 1 MG tablet Take 4 tablets by mouth daily.  Marland Kitchen rOPINIRole (REQUIP) 0.25 MG tablet Take 0.5 mg by mouth daily.  . traMADol (ULTRAM) 50 MG tablet Take 50 mg by mouth daily as needed for moderate pain.  . [DISCONTINUED]  losartan (COZAAR) 50 MG tablet Take 50 mg by mouth daily.     Allergies:   Amoxicillin, Clarithromycin, Flagyl [metronidazole], and Sulfamethoxazole-trimethoprim   Social History   Socioeconomic History  . Marital status: Widowed    Spouse name: Not on file  . Number of children: Not on file  . Years of education: Not on file  . Highest education level: Not on file  Occupational History  . Not on file  Tobacco Use  . Smoking status: Never Smoker  . Smokeless tobacco: Never Used  Vaping Use  . Vaping Use: Never used  Substance and Sexual Activity  . Alcohol use: Yes    Comment: rare  . Drug use: No  . Sexual activity: Not on file  Other Topics Concern  . Not on file  Social History Narrative  . Not on file   Social Determinants of Health   Financial Resource Strain: Not on file  Food Insecurity: Not on file  Transportation Needs: Not on file  Physical Activity: Not on file  Stress: Not on file  Social Connections: Not on file     Family History: The  patient's family history includes COPD in her mother; Heart attack in her mother; Heart failure in her father; Kidney failure in her father. There is no history of Colon cancer or Esophageal cancer.  ROS:   Please see the history of present illness.    All other systems reviewed and are negative.  EKGs/Labs/Other Studies Reviewed:    The following studies were reviewed today: I discussed my findings with the patient at length   Recent Labs: No results found for requested labs within last 8760 hours.  Recent Lipid Panel No results found for: CHOL, TRIG, HDL, CHOLHDL, VLDL, LDLCALC, LDLDIRECT  Physical Exam:    VS:  BP 126/72   Pulse 86   Ht 5' 5.6" (1.666 m)   Wt 174 lb 9.6 oz (79.2 kg)   SpO2 96%   BMI 28.53 kg/m     Wt Readings from Last 3 Encounters:  06/14/20 174 lb 9.6 oz (79.2 kg)  02/25/20 172 lb 9.6 oz (78.3 kg)  02/22/20 171 lb 6.4 oz (77.7 kg)     GEN: Patient is in no acute distress HEENT: Normal NECK: No JVD; No carotid bruits LYMPHATICS: No lymphadenopathy CARDIAC: Hear sounds regular, 2/6 systolic murmur at the apex. RESPIRATORY:  Clear to auscultation without rales, wheezing or rhonchi  ABDOMEN: Soft, non-tender, non-distended MUSCULOSKELETAL:  No edema; No deformity  SKIN: Warm and dry NEUROLOGIC:  Alert and oriented x 3 PSYCHIATRIC:  Normal affect   Signed, Garwin Brothers, MD  06/14/2020 11:36 AM    [facility]

## 2020-06-14 NOTE — Patient Instructions (Signed)
Medication Instructions:  Your physician has recommended you make the following change in your medication:   Stop losartan. Keep a record of your BP/heartrate to let us know how you are feeling in the next week.  *If you need a refill on your cardiac medications before your next appointment, please call your pharmacy*   Lab Work: None ordered If you have labs (blood work) drawn today and your tests are completely normal, you will receive your results only by: Marland Kitchen MyChart Message (if you have MyChart) OR . A paper copy in the mail If you have any lab test that is abnormal or we need to change your treatment, we will call you to review the results.   Testing/Procedures: None ordered   Follow-Up: At [facility], you and your health needs are our priority.  As part of our continuing mission to provide you with exceptional heart care, we have created designated Provider Care Teams.  These Care Teams include your primary Cardiologist (physician) and Advanced Practice Providers (APPs -  Physician Assistants and Nurse Practitioners) who all work together to provide you with the care you need, when you need it.  We recommend signing up for the patient portal called "MyChart".  Sign up information is provided on this After Visit Summary.  MyChart is used to connect with patients for Virtual Visits (Telemedicine).  Patients are able to view lab/test results, encounter notes, upcoming appointments, etc.  Non-urgent messages can be sent to your provider as well.   To learn more about what you can do with MyChart, go to [email or web address].    Your next appointment:   6 month(s)  The format for your next appointment:   In Person  Provider:   Belva Crome, MD   Other Instructions  Blood Pressure Record Sheet To take your blood pressure, you will need a blood pressure machine. You can buy a blood pressure machine (blood pressure monitor) at your clinic, drug store, or online. When  choosing one, consider:  An automatic monitor that has an arm cuff.  A cuff that wraps snugly around your upper arm. You should be able to fit only one finger between your arm and the cuff.  A device that stores blood pressure reading results.  Do not choose a monitor that measures your blood pressure from your wrist or finger. Follow your health care provider's instructions for how to take your blood pressure. To use this form:  Get one reading in the morning (a.m.) 1-2 hours after you take any medicines.  Get one reading in the evening (p.m.) before supper.  Take at least 2 readings with each blood pressure check. This makes sure the results are correct. Wait 1-2 minutes between measurements.  Write down the results in the spaces on this form.  Repeat this once a week, or as told by your health care provider.  Make a follow-up appointment with your health care provider to discuss the results. Blood pressure log Date: _______________________  a.m. _____________________(1st reading) _____________________(2nd reading)  p.m. _____________________(1st reading) _____________________(2nd reading) Date: _______________________  a.m. _____________________(1st reading) _____________________(2nd reading)  p.m. _____________________(1st reading) _____________________(2nd reading) Date: _______________________  a.m. _____________________(1st reading) _____________________(2nd reading)  p.m. _____________________(1st reading) _____________________(2nd reading) Date: _______________________  a.m. _____________________(1st reading) _____________________(2nd reading)  p.m. _____________________(1st reading) _____________________(2nd reading) Date: _______________________  a.m. _____________________(1st reading) _____________________(2nd reading)  p.m. _____________________(1st reading) _____________________(2nd reading) This information is not intended to replace advice given to you  by your health care provider. Make sure you discuss any  questions you have with your health care provider. Document Revised: 06/24/2019 Document Reviewed: 06/24/2019 Elsevier Patient Education  2021 [employer].

## 2020-08-03 ENCOUNTER — Other Ambulatory Visit: Payer: Self-pay | Admitting: Cardiology

## 2020-08-17 ENCOUNTER — Ambulatory Visit (INDEPENDENT_AMBULATORY_CARE_PROVIDER_SITE_OTHER): Payer: Medicare Other

## 2020-08-17 DIAGNOSIS — I480 Paroxysmal atrial fibrillation: Secondary | ICD-10-CM | POA: Diagnosis not present

## 2020-08-17 LAB — CUP PACEART REMOTE DEVICE CHECK
Battery Impedance: 1166 Ohm
Battery Remaining Longevity: 60 mo
Battery Voltage: 2.77 V
Brady Statistic AP VP Percent: 4 %
Brady Statistic AP VS Percent: 85 %
Brady Statistic AS VP Percent: 0 %
Brady Statistic AS VS Percent: 11 %
Date Time Interrogation Session: 20220601104656
Implantable Lead Implant Date: 20131107
Implantable Lead Implant Date: 20131107
Implantable Lead Location: 753859
Implantable Lead Location: 753860
Implantable Lead Model: 4092
Implantable Lead Model: 5076
Implantable Pulse Generator Implant Date: 20131107
Lead Channel Impedance Value: 457 Ohm
Lead Channel Impedance Value: 668 Ohm
Lead Channel Pacing Threshold Amplitude: 0.5 V
Lead Channel Pacing Threshold Amplitude: 0.625 V
Lead Channel Pacing Threshold Pulse Width: 0.4 ms
Lead Channel Pacing Threshold Pulse Width: 0.4 ms
Lead Channel Setting Pacing Amplitude: 1.5 V
Lead Channel Setting Pacing Amplitude: 2 V
Lead Channel Setting Pacing Pulse Width: 0.4 ms
Lead Channel Setting Sensing Sensitivity: 1.4 mV

## 2020-09-08 NOTE — Progress Notes (Signed)
Remote pacemaker transmission.   

## 2020-09-10 ENCOUNTER — Other Ambulatory Visit: Payer: Self-pay | Admitting: Cardiology

## 2020-09-12 ENCOUNTER — Telehealth: Payer: Self-pay | Admitting: Cardiology

## 2020-09-12 MED ORDER — LOSARTAN POTASSIUM 50 MG PO TABS: 50.0000 mg | ORAL_TABLET | Freq: Every day | ORAL | 2 refills | Status: DC

## 2020-09-12 NOTE — Telephone Encounter (Signed)
Called pt but no answer or VM. 

## 2020-09-12 NOTE — Telephone Encounter (Signed)
Pharmacy notified patient reported not tasking Losartan

## 2020-09-12 NOTE — Telephone Encounter (Signed)
*  STAT* If patient is at the pharmacy, call can be transferred to refill team.   1. Which medications need to be refilled? (please list name of each medication and dose if known) Losartan   2. Which pharmacy/location (including street and city if local pharmacy) is medication to be sent to? [facility]    3. Do they need a 30 day or 90 day supply? 90   Patient is out of medication

## 2020-09-12 NOTE — Telephone Encounter (Signed)
Spoke to the patient just now and let her know that it looks like this medication was discontinued during her last visit. She states that her blood pressure was high and she started taking this medication again.   I let her know that I would route this over to Dr. Tomie China to see if he is ok with [place] refilling this.

## 2020-10-24 ENCOUNTER — Encounter: Payer: Self-pay | Admitting: Cardiology

## 2020-10-24 ENCOUNTER — Telehealth: Payer: Self-pay

## 2020-10-24 ENCOUNTER — Ambulatory Visit (INDEPENDENT_AMBULATORY_CARE_PROVIDER_SITE_OTHER): Payer: Medicare Other | Admitting: Cardiology

## 2020-10-24 ENCOUNTER — Other Ambulatory Visit: Payer: Self-pay

## 2020-10-24 VITALS — BP 150/86 | HR 76 | Ht 66.0 in | Wt 181.8 lb

## 2020-10-24 DIAGNOSIS — N183 Chronic kidney disease, stage 3 unspecified: Secondary | ICD-10-CM

## 2020-10-24 DIAGNOSIS — I951 Orthostatic hypotension: Secondary | ICD-10-CM

## 2020-10-24 DIAGNOSIS — E782 Mixed hyperlipidemia: Secondary | ICD-10-CM

## 2020-10-24 DIAGNOSIS — I10 Essential (primary) hypertension: Secondary | ICD-10-CM

## 2020-10-24 DIAGNOSIS — E7800 Pure hypercholesterolemia, unspecified: Secondary | ICD-10-CM

## 2020-10-24 DIAGNOSIS — I480 Paroxysmal atrial fibrillation: Secondary | ICD-10-CM | POA: Diagnosis not present

## 2020-10-24 DIAGNOSIS — R0609 Other forms of dyspnea: Secondary | ICD-10-CM

## 2020-10-24 DIAGNOSIS — Z950 Presence of cardiac pacemaker: Secondary | ICD-10-CM

## 2020-10-24 DIAGNOSIS — R0600 Dyspnea, unspecified: Secondary | ICD-10-CM

## 2020-10-24 NOTE — Telephone Encounter (Signed)
Successful telephone encounter to patient to request manual transmission to evaluate potential episodes that may be causing palpitations. This is a request from Dr. Henrietta Hoover. Patient state she is not at home and will not be able to send manual transmission until sometime later tomorrow. Reviewed most recent scheduled transmission 08/17/20. At that time AMS<0.1%, 5 Atrial High Rates from 02/2020-08/2020. Will route manual transmission to Dr. Tomie China once received.   08/17/20 Transmission

## 2020-10-24 NOTE — Telephone Encounter (Signed)
-----   Message from Eleonore Chiquito, RN sent at 10/24/2020  3:07 PM EDT ----- Regarding: pacemaker Pt was seen today and reports she has been having palpations. Can see if anything showed up on her last transmission. Thanks Shonda

## 2020-10-24 NOTE — Addendum Note (Signed)
Addended by: Eleonore Chiquito on: 10/24/2020 03:20 PM   Modules accepted: Orders

## 2020-10-24 NOTE — Addendum Note (Signed)
Addended by: Eleonore Chiquito on: 10/24/2020 03:15 PM   Modules accepted: Orders

## 2020-10-24 NOTE — Patient Instructions (Addendum)
Medication Instructions:  No medication changes. *If you need a refill on your cardiac medications before your next appointment, please call your pharmacy*   Lab Work: None ordered If you have labs (blood work) drawn today and your tests are completely normal, you will receive your results only by: MyChart Message (if you have MyChart) OR A paper copy in the mail If you have any lab test that is abnormal or we need to change your treatment, we will call you to review the results.   Testing/Procedures: Your physician has requested that you have a lexiscan myoview. For further information please visit [email or web address]. Please follow instruction sheet, as given.  The test will take approximately 3 to 4 hours to complete; you may bring reading material.  If someone comes with you to your appointment, they will need to remain in the main lobby due to limited space in the testing area.   How to prepare for your Myocardial Perfusion Test: Do not eat or drink 3 hours prior to your test, except you may have water. Do not consume products containing caffeine (regular or decaffeinated) 12 hours prior to your test. (ex: coffee, chocolate, sodas, tea). Do bring a list of your current medications with you.  If not listed below, you may take your medications as normal. Do wear comfortable clothes (no dresses or overalls) and walking shoes, tennis shoes preferred (No heels or open toe shoes are allowed). Do NOT wear cologne, perfume, aftershave, or lotions (deodorant is allowed). If these instructions are not followed, your test will have to be rescheduled.  Your physician has requested that you have an echocardiogram. Echocardiography is a painless test that uses sound waves to create images of your heart. It provides your doctor with information about the size and shape of your heart and how well your heart's chambers and valves are working. This procedure takes approximately one hour. There are no  restrictions for this procedure.   Follow-Up: At [facility], you and your health needs are our priority.  As part of our continuing mission to provide you with exceptional heart care, we have created designated Provider Care Teams.  These Care Teams include your primary Cardiologist (physician) and Advanced Practice Providers (APPs -  Physician Assistants and Nurse Practitioners) who all work together to provide you with the care you need, when you need it.  We recommend signing up for the patient portal called "MyChart".  Sign up information is provided on this After Visit Summary.  MyChart is used to connect with patients for Virtual Visits (Telemedicine).  Patients are able to view lab/test results, encounter notes, upcoming appointments, etc.  Non-urgent messages can be sent to your provider as well.   To learn more about what you can do with MyChart, go to [email or web address].    Your next appointment:   6 month(s)  The format for your next appointment:   In Person  Provider:   Rajan Revankar, MD   Other Instructions Cardiac Nuclear Scan A cardiac nuclear scan is a test that is done to check the flow of blood to your heart. It is done when you are resting and when you are exercising. The test looks for problems such as: Not enough blood reaching a portion of the heart. The heart muscle not working as it should. You may need this test if: You have heart disease. You have had lab results that are not normal. You have had heart surgery or a balloon procedure to open up blocked arteries (  angioplasty). You have chest pain. You have shortness of breath. In this test, a special dye (tracer) is put into your bloodstream. The tracer will travel to your heart. A camera will then take pictures of your heart to see how the tracer moves through your heart. This test is usually done at a hospital and takes 2-4 hours. Tell a doctor about: Any allergies you have. All medicines you are  taking, including vitamins, herbs, eye drops, creams, and over-the-counter medicines. Any problems you or family members have had with anesthetic medicines. Any blood disorders you have. Any surgeries you have had. Any medical conditions you have. Whether you are pregnant or may be pregnant. What are the risks? Generally, this is a safe test. However, problems may occur, such as: Serious chest pain and heart attack. This is only a risk if the stress portion of the test is done. Rapid heartbeat. A feeling of warmth in your chest. This feeling usually does not last long. Allergic reaction to the tracer. What happens before the test? Ask your doctor about changing or stopping your normal medicines. This is important. Follow instructions from your doctor about what you cannot eat or drink. Remove your jewelry on the day of the test. What happens during the test? An IV tube will be inserted into one of your veins. Your doctor will give you a small amount of tracer through the IV tube. You will wait for 20-40 minutes while the tracer moves through your bloodstream. Your heart will be monitored with an electrocardiogram (ECG). You will lie down on an exam table. Pictures of your heart will be taken for about 15-20 minutes. You may also have a stress test. For this test, one of these things may be done: You will be asked to exercise on a treadmill or a stationary bike. You will be given medicines that will make your heart work harder. This is done if you are unable to exercise. When blood flow to your heart has peaked, a tracer will again be given through the IV tube. After 20-40 minutes, you will get back on the exam table. More pictures will be taken of your heart. Depending on the tracer that is used, more pictures may need to be taken 3-4 hours later. Your IV tube will be removed when the test is over. The test may vary among doctors and hospitals. What happens after the test? Ask your  doctor: Whether you can return to your normal schedule, including diet, activities, and medicines. Whether you should drink more fluids. This will help to remove the tracer from your body. Drink enough fluid to keep your pee (urine) pale yellow. Ask your doctor, or the department that is doing the test: When will my results be ready? How will I get my results? Summary A cardiac nuclear scan is a test that is done to check the flow of blood to your heart. Tell your doctor whether you are pregnant or may be pregnant. Before the test, ask your doctor about changing or stopping your normal medicines. This is important. Ask your doctor whether you can return to your normal activities. You may be asked to drink more fluids. This information is not intended to replace advice given to you by your health care provider. Make sure you discuss any questions you have with your health care provider. Document Revised: 06/25/2018 Document Reviewed: 08/19/2017 Elsevier Patient Education  2021 Elsevier Inc.    Echocardiogram An echocardiogram is a test that uses sound waves (ultrasound)   to produce images of the heart. Images from an echocardiogram can provide important information about: Heart size and shape. The size and thickness and movement of your heart's walls. Heart muscle function and strength. Heart valve function or if you have stenosis. Stenosis is when the heart valves are too narrow. If blood is flowing backward through the heart valves (regurgitation). A tumor or infectious growth around the heart valves. Areas of heart muscle that are not working well because of poor blood flow or injury from a heart attack. Aneurysm detection. An aneurysm is a weak or damaged part of an artery wall. The wall bulges out from the normal force of blood pumping through the body. Tell a health care provider about: Any allergies you have. All medicines you are taking, including vitamins, herbs, eye drops,  creams, and over-the-counter medicines. Any blood disorders you have. Any surgeries you have had. Any medical conditions you have. Whether you are pregnant or may be pregnant. What are the risks? Generally, this is a safe test. However, problems may occur, including an allergic reaction to dye (contrast) that may be used during the test. What happens before the test? No specific preparation is needed. You may eat and drink normally. What happens during the test? You will take off your clothes from the waist up and put on a hospital gown. Electrodes or electrocardiogram (ECG)patches may be placed on your chest. The electrodes or patches are then connected to a device that monitors your heart rate and rhythm. You will lie down on a table for an ultrasound exam. A gel will be applied to your chest to help sound waves pass through your skin. A handheld device, called a transducer, will be pressed against your chest and moved over your heart. The transducer produces sound waves that travel to your heart and bounce back (or "echo" back) to the transducer. These sound waves will be captured in real-time and changed into images of your heart that can be viewed on a video monitor. The images will be recorded on a computer and reviewed by your health care provider. You may be asked to change positions or hold your breath for a short time. This makes it easier to get different views or better views of your heart. In some cases, you may receive contrast through an IV in one of your veins. This can improve the quality of the pictures from your heart. The procedure may vary among health care providers and hospitals.    What can I expect after the test? You may return to your normal, everyday life, including diet, activities, and medicines, unless your health care provider tells you not to do that. Follow these instructions at home: It is up to you to get the results of your test. Ask your health care  provider, or the department that is doing the test, when your results will be ready. Keep all follow-up visits. This is important. Summary An echocardiogram is a test that uses sound waves (ultrasound) to produce images of the heart. Images from an echocardiogram can provide important information about the size and shape of your heart, heart muscle function, heart valve function, and other possible heart problems. You do not need to do anything to prepare before this test. You may eat and drink normally. After the echocardiogram is completed, you may return to your normal, everyday life, unless your health care provider tells you not to do that. This information is not intended to replace advice given to you by   your health care provider. Make sure you discuss any questions you have with your health care provider. Document Revised: 10/27/2019 Document Reviewed: 10/27/2019 Elsevier Patient Education  2021 Elsevier Inc.  

## 2020-10-24 NOTE — Progress Notes (Addendum)
Cardiology Office Note:    Date:  10/24/2020   ID:  Denise Francis, DOB 1935-05-28, MRN [id number]  PCP:  Olive Bass, MD  Cardiologist:  Garwin Brothers, MD   Referring MD: Olive Bass, MD    ASSESSMENT:    1. Essential hypertension   2. Hypercholesterolemia   3. Orthostatic hypotension   4. PAF (paroxysmal atrial fibrillation) (HCC)   5. Cardiac pacemaker in situ   6. Stage 3 chronic kidney disease, unspecified whether stage 3a or 3b CKD (HCC)   7. Mixed dyslipidemia    PLAN:    In order of problems listed above:  Primary prevention stressed with the patient.  Importance of compliance with diet medication stressed and she vocalized understanding. Paroxysmal atrial fibrillation: Rhythm pressure stable.  She is not on anticoagulation because of gait instability and high propensity for fall.  She has history of palpitations and dizzy feelings.  We have not been able to diagnose exactly what her issue is.  We have kept her off her blood pressure medications for fear of hypotension. Essential hypertension: Blood pressure stable.  Her blood pressures are better at home.  We have resisted from aggressive blood pressure control and her for fear of hypotension and falls. Pacemaker in situ: We will get in touch with our electrophysiology colleagues about pacemaker evaluation to see if she has any significant arrhythmias. Patient will be seen in follow-up appointment in 3 months or earlier if the patient has any concerns Addendum: After I left the room the nurse mentioned to me the patient also has some shortness of breath.  She noted that this was happening when the patient walked into into the office.  I did not have this information earlier.  In view of his multiple symptoms I would like to have a echocardiogram her to assess murmur heard on auscultation.  Also Lexiscan sestamibi to assess for any objective evidence of coronary artery disease.  I discussed this with her and she  understands.  Further recommendations will be made based on the findings of the aforementioned test.  She knows to go to the nearest emergency room for any concerning symptoms.    Medication Adjustments/Labs and Tests Ordered: Current medicines are reviewed at length with the patient today.  Concerns regarding medicines are outlined above.  No orders of the defined types were placed in this encounter.  No orders of the defined types were placed in this encounter.    No chief complaint on file.    History of Present Illness:    Denise Francis is a 85 y.o. female.  Patient has past medical history of paroxysmal atrial fibrillation, essential hypertension dyslipidemia and has a permanent pacemaker.  She denies any problems at this time.  It appears that she may have orthostatic hypotension.  We have taken her off of her blood pressure medication.  Her blood pressure today is on the higher side.  No chest pain orthopnea or PND.  She gives history of dizziness at times.  Her pacemaker evaluation has not shown any significant arrhythmias.  At the time of my evaluation, the patient is alert awake oriented and in no distress.  Past Medical History:  Diagnosis Date   Acquired unequal limb length 09/04/2018   Acute cystitis without hematuria 02/15/2017   Acute right ankle pain 10/23/2018   Formatting of this note might be different from the original. 2020   Allergic rhinitis 04/07/2016   Altered gait 09/04/2018   Altered sensation  due to recent stroke 08/25/2018   2020   Anemia, iron deficiency 04/07/2016   Arthritis 09/27/2015   Arthropathy    nos, multiple sites with polymyalgia rheumatica   Artificial cardiac pacemaker 09/15/2014   Overview:  [employer]    Asthma    Asthma in adult, mild persistent, uncomplicated 05/21/2016   Bradycardia 02/02/2015   Cardiac pacemaker in situ 02/10/2020   Cerebral artery occlusion with cerebral infarction (HCC) 08/01/2018   Cervical radiculitis 03/21/2016    Chronic pain of right knee 10/30/2012   Overview:  2017: onset 2017: ORTHO   CKD (chronic kidney disease), stage III (HCC) 07/05/2015   CRI (chronic renal insufficiency), stage 3 (moderate) (HCC)    Diarrhea 12/11/2018   Formatting of this note might be different from the original. 2020   Diverticulitis of colon with hemorrhage    Dysarthria 08/01/2018   Edema of right lower leg 10/23/2018   Formatting of this note might be different from the original. 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 2017:   Orthostatic hypotension 02/10/2020   OSA on CPAP 08/12/2015   Osteoporosis 08/12/2015   PAF (paroxysmal atrial fibrillation) (HCC) 02/25/2020   Pain in the groin, left 01/09/2018   2019   Pain of both wrist joints 04/17/2016   Formatting of this note might be different from the original. 2017: onset 2017: ORTHO   PMR (polymyalgia rheumatica) (HCC) 04/30/2016   Polio 04/07/2016   Prediabetes 08/27/2017   Formatting of this note might be different from the original. 2020: 110/6.3   Presence of permanent cardiac pacemaker 08/20/2017   Pulmonary hypertension (HCC) 09/15/2014   Rectal bleeding 01/04/2017   Overview:  2018   Recurrent major depressive disorder, in remission (HCC) 09/06/2015   RLS (restless legs syndrome) 08/12/2015   Screening for diabetes  mellitus (DM) 03/03/2018   Sick sinus syndrome (HCC) 08/12/2015   Spinal stenosis of lumbar region with neurogenic claudication 01/05/2016   Steatorrhea 05/14/2018   2020   Stroke [facility](HCC)    Jul 27 2018   Suspected COVID-19 virus infection 03/21/2020   Formatting of this note might be different from the original. 03/21/2020   UTI symptoms 03/14/2020   Vitamin D deficiency 03/25/2019    Past Surgical History:  Procedure Laterality Date   BACK SURGERY     x2   COLONOSCOPY  08/16/2010   Attempted. Moderate-to-severe sigmoid diverticulosis. Otherwise grossly normal attempted colonoscopy up to 40 cm (The patient had very poor preparation)   DENTAL SURGERY  12/24/2019   all teeth removed and a plate being placed in    ESOPHAGOGASTRODUODENOSCOPY  12/20/2010   Small hiatal hernia. Mild gastritis   KNEE SURGERY     WRIST SURGERY      Current Medications: Current Meds  Medication Sig   atorvastatin (LIPITOR) 40 MG tablet Take 1 tablet (40  mg total) by mouth daily.   cetirizine (ZYRTEC) 10 MG tablet Take 10 mg by mouth daily.    chlorthalidone (HYGROTON) 25 MG tablet Take 12.5 mg by mouth daily.   clopidogrel (PLAVIX) 75 MG tablet Take 75 mg by mouth daily.   ezetimibe (ZETIA) 10 MG tablet Take 10 mg by mouth daily.   famotidine (PEPCID) 40 MG tablet Take 40 mg by mouth daily.   FIBER PO Take by mouth. 2 teaspoon every morning with coffee   fluticasone (FLONASE) 50 MCG/ACT nasal spray Place 2 sprays into both nostrils daily.    gabapentin (NEURONTIN) 300 MG capsule Take 300 mg daily by mouth.   hydrALAZINE (APRESOLINE) 25 MG tablet Take 25 mg by mouth as needed (systolic over 160).   losartan (COZAAR) 50 MG tablet Take 1 tablet (50 mg total) by mouth daily.   montelukast (SINGULAIR) 10 MG tablet Take 10 mg by mouth at bedtime.    predniSONE (DELTASONE) 1 MG tablet Take 4 tablets by mouth daily.   rOPINIRole (REQUIP) 0.25 MG tablet Take 0.5 mg by mouth daily.   traMADol (ULTRAM) 50 MG tablet  Take 50 mg by mouth daily as needed for moderate pain.     Allergies:   Amoxicillin, Clarithromycin, Flagyl [metronidazole], and Sulfamethoxazole-trimethoprim   Social History   Socioeconomic History   Marital status: Widowed    Spouse name: Not on file   Number of children: Not on file   Years of education: Not on file   Highest education level: Not on file  Occupational History   Not on file  Tobacco Use   Smoking status: Never   Smokeless tobacco: Never  Vaping Use   Vaping Use: Never used  Substance and Sexual Activity   Alcohol use: Yes    Comment: rare   Drug use: No   Sexual activity: Not on file  Other Topics Concern   Not on file  Social History Narrative   Not on file   Social Determinants of Health   Financial Resource Strain: Not on file  Food Insecurity: Not on file  Transportation Needs: Not on file  Physical Activity: Not on file  Stress: Not on file  Social Connections: Not on file     Family History: The patient's family history includes COPD in her mother; Heart attack in her mother; Heart failure in her father; Kidney failure in her father. There is no history of Colon cancer or Esophageal cancer.  ROS:   Please see the history of present illness.    All other systems reviewed and are negative.  EKGs/Labs/Other Studies Reviewed:    The following studies were reviewed today: I discussed my findings with the patient at length I reviewed pacemaker evaluations in the past.   Recent Labs: No results found for requested labs within last 8760 hours.  Recent Lipid Panel No results found for: CHOL, TRIG, HDL, CHOLHDL, VLDL, LDLCALC, LDLDIRECT  Physical Exam:    VS:  BP (!) 150/86   Pulse 76   Ht 5\' 6"  (1.676 m)   Wt 181 lb 12.8 oz (82.5 kg)   SpO2 96%   BMI 29.34 kg/m     Wt Readings from Last 3 Encounters:  10/24/20 181 lb 12.8 oz (82.5 kg)  06/14/20 174 lb 9.6 oz (79.2 kg)  02/25/20 172 lb 9.6 oz (78.3 kg)     GEN: Patient is in no  acute distress HEENT: Normal NECK: No JVD; No carotid bruits LYMPHATICS: No lymphadenopathy CARDIAC: Hear  sounds regular, 2/6 systolic murmur at the apex. RESPIRATORY:  Clear to auscultation without rales, wheezing or rhonchi  ABDOMEN: Soft, non-tender, non-distended MUSCULOSKELETAL:  No edema; No deformity  SKIN: Warm and dry NEUROLOGIC:  Alert and oriented x 3 PSYCHIATRIC:  Normal affect   Signed, Garwin Brothers, MD  10/24/2020 3:08 PM    [facility]

## 2020-10-25 NOTE — Telephone Encounter (Signed)
Unsuccessful telephone encounter to Denise Francis to follow up on previous provider request to send manual remote transmission. Per conversation with patient yesterday, patient would send as soon as she completed her dental appt this am. No transmission has been received at this time. Hip[employer] compliant VM message left requesting call back to [phone].

## 2020-10-26 ENCOUNTER — Telehealth: Payer: Self-pay

## 2020-10-26 NOTE — Telephone Encounter (Signed)
Patients remote monitor is not working. She has ordered a new one which should arrive within the next 7-10 days. Offered patient device clinic apt. 10/27/20 @ 12:00. Location, date and time discussed with patient.

## 2020-10-26 NOTE — Telephone Encounter (Signed)
The patient tried to send transmission. She was unsuccessful sending the transmission. I conference call [employer] tech support. They are sending her a new handheld in 7-10 business days.

## 2020-10-27 ENCOUNTER — Telehealth (HOSPITAL_COMMUNITY): Payer: Self-pay | Admitting: *Deleted

## 2020-10-27 NOTE — Telephone Encounter (Signed)
Patient given detailed instructions per Myocardial Perfusion Study Information Sheet for the test on 11/03/20 at 8:15. Patient notified to arrive 15 minutes early and that it is imperative to arrive on time for appointment to keep from having the test rescheduled.  If you need to cancel or reschedule your appointment, please call the office within 24 hours of your appointment. . Patient verbalized understanding.Denise Francis

## 2020-10-27 NOTE — Addendum Note (Signed)
Addended by: Belva Crome R on: 10/27/2020 11:34 AM   Modules accepted: Orders

## 2020-10-27 NOTE — Addendum Note (Signed)
Addended by: Eleonore Chiquito on: 10/27/2020 11:07 AM   Modules accepted: Orders

## 2020-11-02 ENCOUNTER — Other Ambulatory Visit: Payer: Medicare Other

## 2020-11-03 ENCOUNTER — Ambulatory Visit (INDEPENDENT_AMBULATORY_CARE_PROVIDER_SITE_OTHER): Payer: Medicare Other

## 2020-11-03 ENCOUNTER — Other Ambulatory Visit: Payer: Self-pay

## 2020-11-03 DIAGNOSIS — Z950 Presence of cardiac pacemaker: Secondary | ICD-10-CM

## 2020-11-03 DIAGNOSIS — I480 Paroxysmal atrial fibrillation: Secondary | ICD-10-CM

## 2020-11-03 DIAGNOSIS — R0600 Dyspnea, unspecified: Secondary | ICD-10-CM

## 2020-11-03 DIAGNOSIS — R0609 Other forms of dyspnea: Secondary | ICD-10-CM

## 2020-11-03 LAB — MYOCARDIAL PERFUSION IMAGING
LV dias vol: 56 mL (ref 46–106)
LV sys vol: 8 mL
Peak HR: 73 {beats}/min
Rest HR: 74 {beats}/min
SDS: 1
SRS: 1
SSS: 2
TID: 0.86

## 2020-11-03 MED ORDER — TECHNETIUM TC 99M TETROFOSMIN IV KIT
11.0000 | PACK | Freq: Once | INTRAVENOUS | Status: AC | PRN
  Administered 2020-11-03: 11 via INTRAVENOUS

## 2020-11-03 MED ORDER — REGADENOSON 0.4 MG/5ML IV SOLN
0.4000 mg | Freq: Once | INTRAVENOUS | Status: AC
  Administered 2020-11-03: 0.4 mg via INTRAVENOUS

## 2020-11-03 MED ORDER — TECHNETIUM TC 99M TETROFOSMIN IV KIT
32.3000 | PACK | Freq: Once | INTRAVENOUS | Status: AC | PRN
  Administered 2020-11-03: 32.3 via INTRAVENOUS

## 2020-11-04 ENCOUNTER — Telehealth: Payer: Self-pay | Admitting: Cardiology

## 2020-11-04 NOTE — Telephone Encounter (Signed)
Patient returning call to discuss test results. Please call back

## 2020-11-04 NOTE — Telephone Encounter (Signed)
The patient has been notified of the result and verbalized understanding.  All questions (if any) were answered. Sampson Goon, RN 11/04/2020 1:10 PM

## 2020-11-11 ENCOUNTER — Other Ambulatory Visit: Payer: Self-pay

## 2020-11-11 ENCOUNTER — Ambulatory Visit (INDEPENDENT_AMBULATORY_CARE_PROVIDER_SITE_OTHER): Payer: Medicare Other

## 2020-11-11 DIAGNOSIS — R0600 Dyspnea, unspecified: Secondary | ICD-10-CM | POA: Diagnosis not present

## 2020-11-11 DIAGNOSIS — Z950 Presence of cardiac pacemaker: Secondary | ICD-10-CM | POA: Diagnosis not present

## 2020-11-11 DIAGNOSIS — R0609 Other forms of dyspnea: Secondary | ICD-10-CM

## 2020-11-11 DIAGNOSIS — I480 Paroxysmal atrial fibrillation: Secondary | ICD-10-CM | POA: Diagnosis not present

## 2020-11-11 LAB — ECHOCARDIOGRAM COMPLETE
Area-P 1/2: 4.39 cm2
S' Lateral: 3.1 cm

## 2020-11-16 ENCOUNTER — Ambulatory Visit (INDEPENDENT_AMBULATORY_CARE_PROVIDER_SITE_OTHER): Payer: Medicare Other

## 2020-11-16 DIAGNOSIS — I495 Sick sinus syndrome: Secondary | ICD-10-CM

## 2020-11-16 IMAGING — DX DG ABDOMEN 2V
2 series · 2 of 2 positions shown · non-contrast
Comparison: None.

CLINICAL DATA: Fecal incontinence x 1 year but is not getting
better. HX: GERD, Diverticulitis

EXAM:
ABDOMEN - 2 VIEW, supine and erect

[abdomen supine]
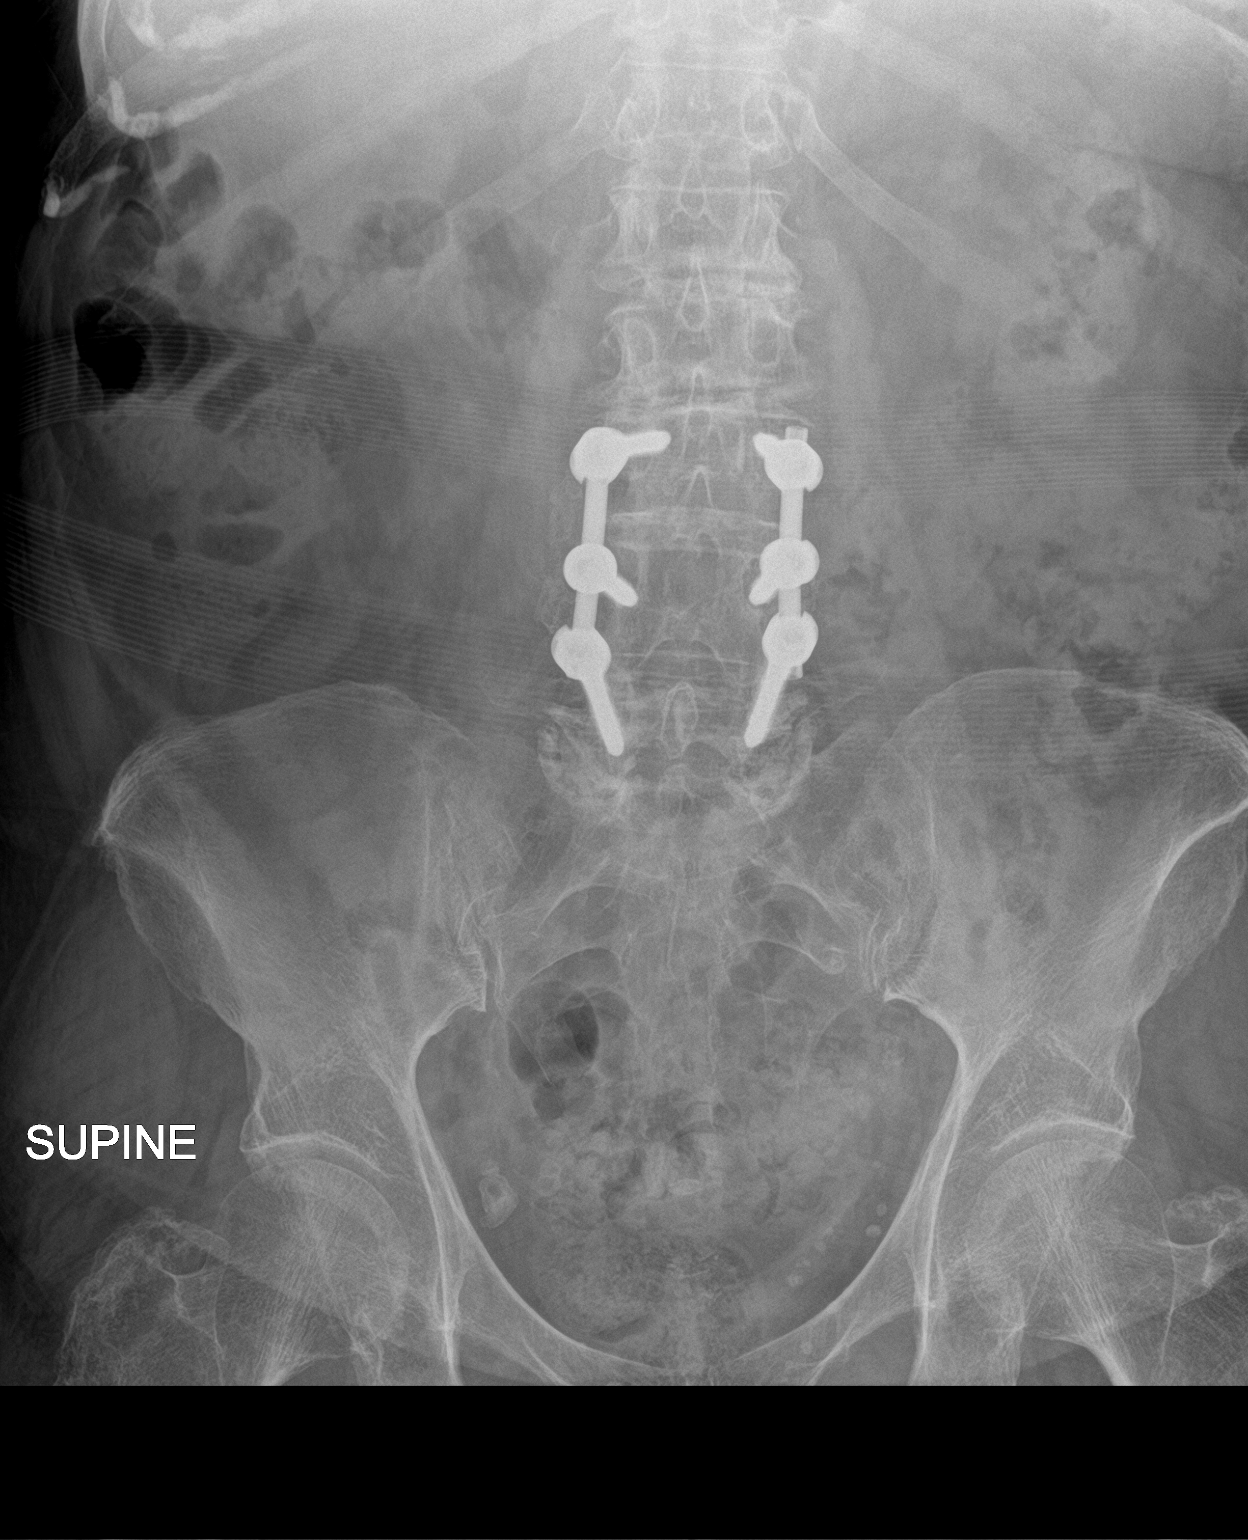

[abdomen erect]
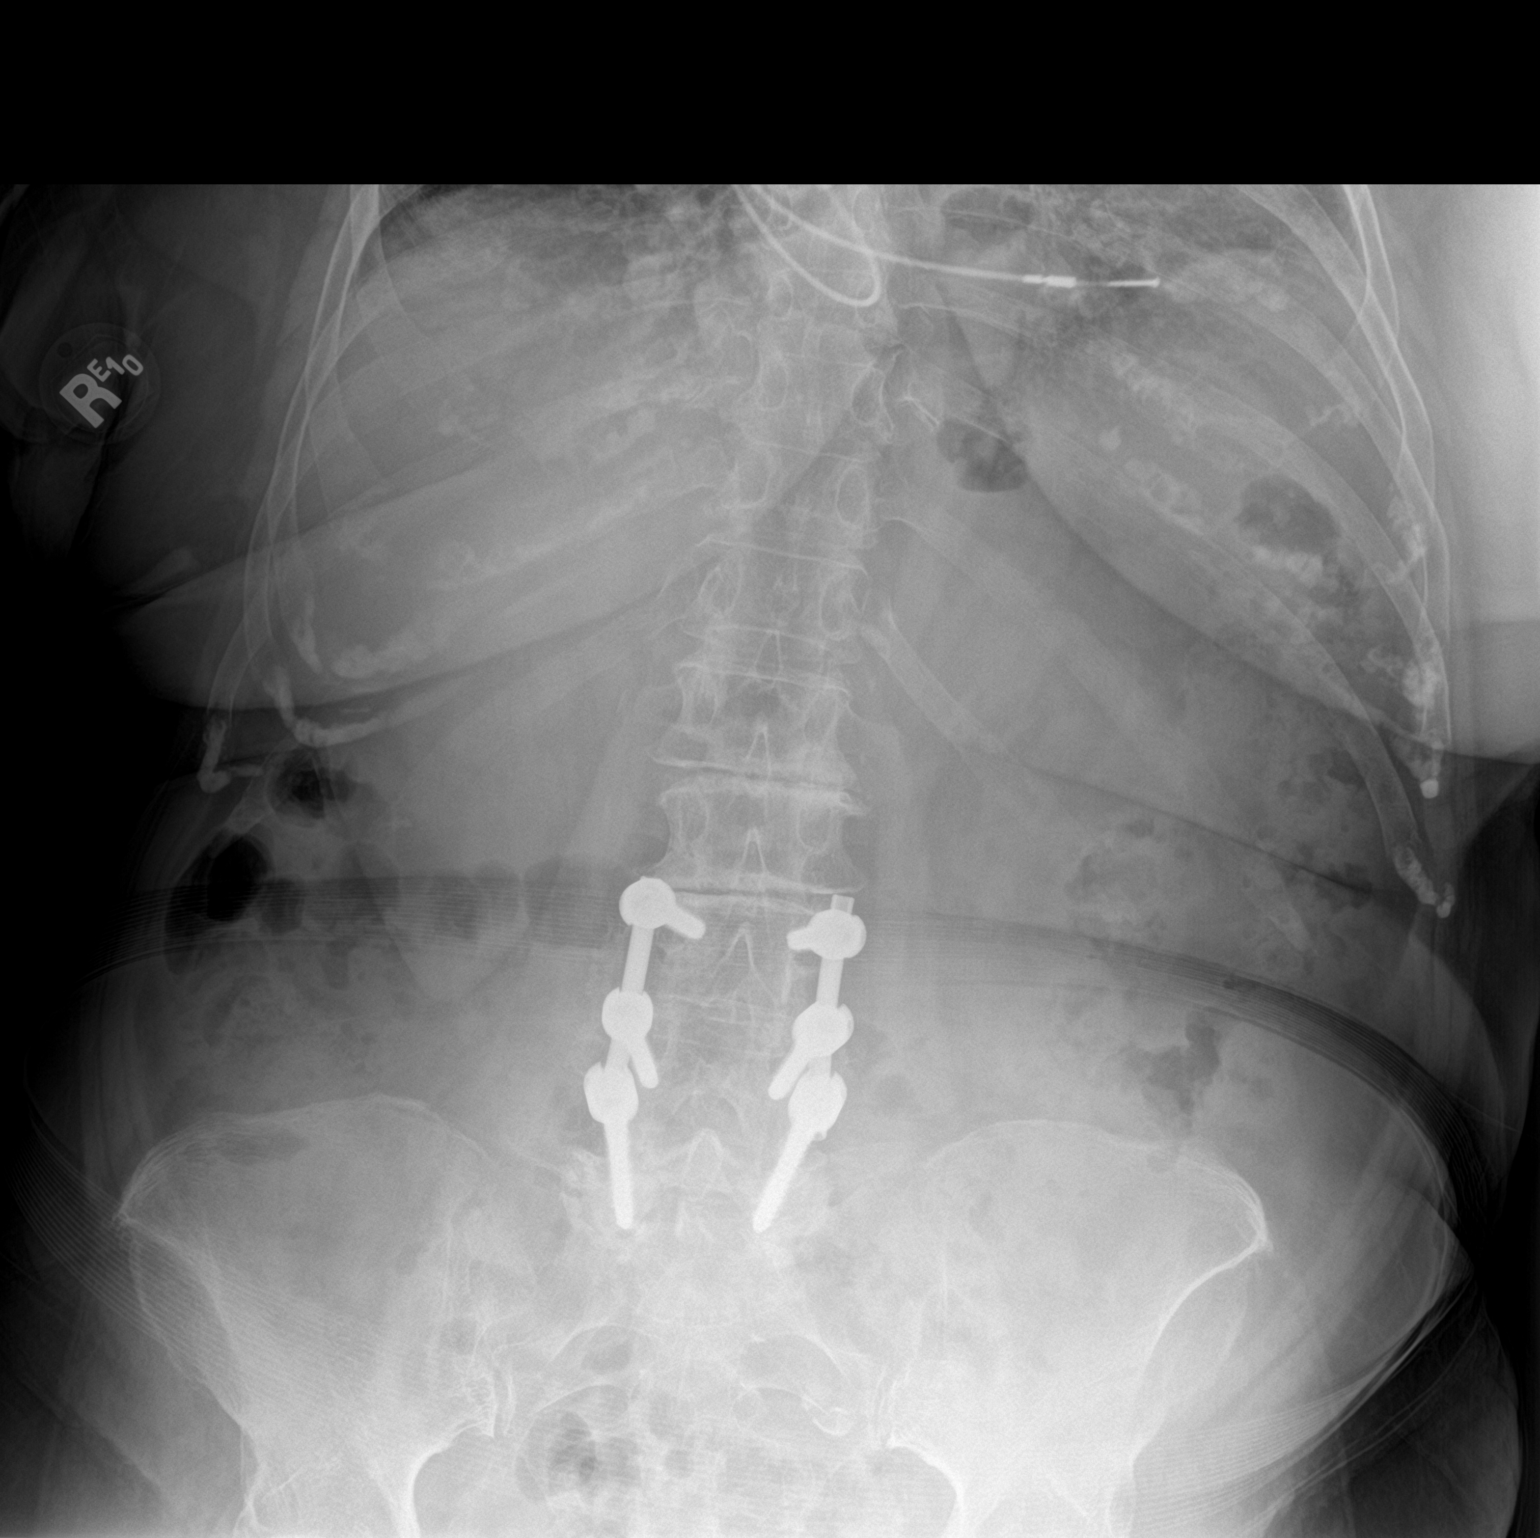

[2 of 2 positions shown; findings below may reference images not displayed]

FINDINGS: The bowel gas pattern is normal. Stool throughout the colon. There
is no evidence of free air. No radio-opaque calculi or other
significant radiographic abnormality is seen.

Surgical hardware of the lower lumbar spine. Severe multilevel
degenerative changes of the lumbar spine.

Partially visualized 2 lead cardiac pacemaker.
IMPRESSION: Nonobstructive bowel gas pattern.

## 2020-11-22 LAB — CUP PACEART REMOTE DEVICE CHECK
Battery Impedance: 1274 Ohm
Battery Remaining Longevity: 56 mo
Battery Voltage: 2.76 V
Brady Statistic AP VP Percent: 3 %
Brady Statistic AP VS Percent: 88 %
Brady Statistic AS VP Percent: 0 %
Brady Statistic AS VS Percent: 9 %
Date Time Interrogation Session: 20220901114755
Implantable Lead Implant Date: 20131107
Implantable Lead Implant Date: 20131107
Implantable Lead Location: 753859
Implantable Lead Location: 753860
Implantable Lead Model: 4092
Implantable Lead Model: 5076
Implantable Pulse Generator Implant Date: 20131107
Lead Channel Impedance Value: 445 Ohm
Lead Channel Impedance Value: 684 Ohm
Lead Channel Pacing Threshold Amplitude: 0.625 V
Lead Channel Pacing Threshold Amplitude: 0.625 V
Lead Channel Pacing Threshold Pulse Width: 0.4 ms
Lead Channel Pacing Threshold Pulse Width: 0.4 ms
Lead Channel Setting Pacing Amplitude: 1.5 V
Lead Channel Setting Pacing Amplitude: 2 V
Lead Channel Setting Pacing Pulse Width: 0.4 ms
Lead Channel Setting Sensing Sensitivity: 1.4 mV

## 2020-11-29 NOTE — Progress Notes (Signed)
Remote pacemaker transmission.   

## 2021-02-22 ENCOUNTER — Ambulatory Visit (INDEPENDENT_AMBULATORY_CARE_PROVIDER_SITE_OTHER): Payer: Medicare Other | Admitting: Cardiology

## 2021-02-22 ENCOUNTER — Other Ambulatory Visit: Payer: Self-pay

## 2021-02-22 ENCOUNTER — Encounter: Payer: Self-pay | Admitting: Cardiology

## 2021-02-22 VITALS — BP 140/82 | HR 81 | Ht 66.0 in | Wt 186.0 lb

## 2021-02-22 DIAGNOSIS — I495 Sick sinus syndrome: Secondary | ICD-10-CM

## 2021-02-22 NOTE — Progress Notes (Signed)
Electrophysiology Office Note   Date:  02/22/2021   ID:  Denise Francis, DOB [birth year], MRN [id number]  PCP:  Olive Bass, MD  Cardiologist:  Revankar Primary Electrophysiologist:  Jahnya Trindade Jorja Loa, MD    Chief Complaint  Patient presents with   Annual Exam      History of Present Illness: Denise Francis is a 85 y.o. female who is being seen today for the evaluation of pacemaker at the request of Glean Hess Revankar. Presenting today for electrophysiology evaluation.    She has a history significant for hypertension, pulmonary hypertension, polymyalgia rheumatica, sick sinus syndrome.  She is status post Medtronic dual-chamber pacemaker.  Today, denies symptoms of palpitations, chest pain, shortness of breath, orthopnea, PND, lower extremity edema, claudication, dizziness, presyncope, syncope, bleeding, or neurologic sequela. The patient is tolerating medications without difficulties.  Since being seen she has done well.  She has no chest pain or shortness of breath.  Is able to do all her daily activities.  She does have some fatigue, but she feels that this is due to deconditioning.  Past Medical History:  Diagnosis Date   Acquired unequal limb length 09/04/2018   Acute cystitis without hematuria 02/15/2017   Acute right ankle pain 10/23/2018   Formatting of this note might be different from the original. 2020   Allergic rhinitis 04/07/2016   Altered gait 09/04/2018   Altered sensation due to recent stroke 08/25/2018   2020   Anemia, iron deficiency 04/07/2016   Arthritis 09/27/2015   Arthropathy    nos, multiple sites with polymyalgia rheumatica   Artificial cardiac pacemaker 09/15/2014   Overview:  Medtronic    Asthma    Asthma in adult, mild persistent, uncomplicated 05/21/2016   Bradycardia 02/02/2015   Cardiac pacemaker in situ 02/10/2020   Cerebral artery occlusion with cerebral infarction (HCC) 08/01/2018   Cervical radiculitis 03/21/2016   Chronic pain of right  knee 10/30/2012   Overview:  2017: onset 2017: ORTHO   CKD (chronic kidney disease), stage III (HCC) 07/05/2015   CRI (chronic renal insufficiency), stage 3 (moderate) (HCC)    Diarrhea 12/11/2018   Formatting of this note might be different from the original. 2020   Diverticulitis of colon with hemorrhage    Dysarthria 08/01/2018   Edema of right lower leg 10/23/2018   Formatting of this note might be different from the original. 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 2017:   Orthostatic hypotension 02/10/2020   OSA on CPAP 08/12/2015   Osteoporosis 08/12/2015   PAF (paroxysmal atrial fibrillation) (HCC) 02/25/2020   Pain in the groin, left 01/09/2018   2019   Pain of both wrist joints 04/17/2016   Formatting of this note might be different from the original. 2017: onset 2017: ORTHO   PMR (polymyalgia rheumatica) (HCC) 04/30/2016   Polio 04/07/2016  Prediabetes 08/27/2017   Formatting of this note might be different from the original. 2020: 110/6.3   Presence of permanent cardiac pacemaker 08/20/2017   Pulmonary hypertension ([place]) 09/15/2014   Rectal bleeding 01/04/2017   Overview:  2018   Recurrent major depressive disorder, in remission ([place]) 09/06/2015   RLS (restless legs syndrome) 08/12/2015   Screening for diabetes mellitus (DM) 03/03/2018    Sick sinus syndrome ([place]) 08/12/2015   Spinal stenosis of lumbar region with neurogenic claudication 01/05/2016   Steatorrhea 05/14/2018   2020   Stroke [facility])    Jul 27 2018   Suspected COVID-19 virus infection 03/21/2020   Formatting of this note might be different from the original. 03/21/2020   UTI symptoms 03/14/2020   Vitamin D deficiency 03/25/2019   Past Surgical History:  Procedure Laterality Date   BACK SURGERY     x2   COLONOSCOPY  08/16/2010   Attempted. Moderate-to-severe sigmoid diverticulosis. Otherwise grossly normal attempted colonoscopy up to 40 cm (The patient had very poor preparation)   DENTAL SURGERY  12/24/2019   all teeth removed and a plate being placed in    ESOPHAGOGASTRODUODENOSCOPY  12/20/2010   Small hiatal hernia. Mild gastritis   KNEE SURGERY     WRIST SURGERY       Current Outpatient Medications  Medication Sig Dispense Refill   atorvastatin (LIPITOR) 40 MG tablet Take 1 tablet (40 mg total) by mouth daily. 30 tablet 3   cetirizine (ZYRTEC) 10 MG tablet Take 10 mg by mouth daily.      chlorthalidone (HYGROTON) 25 MG tablet Take 12.5 mg by mouth daily.     clopidogrel (PLAVIX) 75 MG tablet Take 75 mg by mouth daily.     ezetimibe (ZETIA) 10 MG tablet Take 10 mg by mouth daily.     famotidine (PEPCID) 40 MG tablet Take 40 mg by mouth daily.     FIBER PO Take by mouth. 2 teaspoon every morning with coffee     fluticasone (FLONASE) 50 MCG/ACT nasal spray Place 2 sprays into both nostrils daily.      gabapentin (NEURONTIN) 300 MG capsule Take 300 mg daily by mouth.     hydrALAZINE (APRESOLINE) 25 MG tablet Take 25 mg by mouth as needed (systolic over 0000000).     montelukast (SINGULAIR) 10 MG tablet Take 10 mg by mouth at bedtime.      predniSONE (DELTASONE) 1 MG tablet Take 4 tablets by mouth daily.     rOPINIRole (REQUIP) 0.25 MG tablet Take 0.5 mg by mouth daily.     traMADol (ULTRAM) 50 MG tablet Take 50 mg by mouth daily as needed for moderate pain.      No current facility-administered medications for this visit.    Allergies:   Amoxicillin, Clarithromycin, Flagyl [metronidazole], and Sulfamethoxazole-trimethoprim   Social History:  The patient  reports that she has never smoked. She has never used smokeless tobacco. She reports current alcohol use. She reports that she does not use drugs.   Family History:  The patient's family history includes COPD in her mother; Heart attack in her mother; Heart failure in her father; Kidney failure in her father.    ROS:  Please see the history of present illness.   Otherwise, review of systems is positive for none.   All other systems are reviewed and negative.   PHYSICAL EXAM: VS:  BP 140/82 (BP Location: Right Arm, Patient Position: Sitting, Cuff Size: Normal)   Pulse 81   Ht 5'  6" (1.676 m)   Wt 186 lb (84.4 kg)   SpO2 97%   BMI 30.02 kg/m  , BMI Body mass index is 30.02 kg/m. GEN: Well nourished, well developed, in no acute distress  HEENT: normal  Neck: no JVD, carotid bruits, or masses Cardiac: RRR; no murmurs, rubs, or gallops,no edema  Respiratory:  clear to auscultation bilaterally, normal work of breathing GI: soft, nontender, nondistended, + BS MS: no deformity or atrophy  Skin: warm and dry, device site well healed Neuro:  Strength and sensation are intact Psych: euthymic mood, full affect  EKG:  EKG is ordered today. Personal review of the ekg ordered shows A paced, rate 81  Personal review of the device interrogation today. Results in [place]: No results found for requested labs within last 8760 hours.    Lipid Panel  No results found for: CHOL, TRIG, HDL, CHOLHDL, VLDL, LDLCALC, LDLDIRECT   Wt Readings from Last 3 Encounters:  02/22/21 186 lb (84.4 kg)  11/03/20 181 lb (82.1 kg)  10/24/20 181 lb 12.8 oz (82.5 kg)      Other studies Reviewed: Additional studies/ records that were reviewed today include: Myoview 11/03/2020 Nuclear stress EF:  85%. There was no ST segment deviation noted during stress. The study is normal. This is a low risk study. The left ventricular ejection fraction is normal (55-65%).  TTE 11/11/2020  1. Left ventricular ejection fraction, by estimation, is 60 to 65%. The  left ventricle has normal function. The left ventricle has no regional  wall motion abnormalities. There is mild concentric left ventricular  hypertrophy. Left ventricular diastolic  parameters are consistent with Grade I diastolic dysfunction (impaired  relaxation). The average left ventricular global longitudinal strain is  -10.3 %. The global longitudinal strain is abnormal.   2. Right ventricular systolic function is mildly reduced. The right  ventricular size is normal. There is normal pulmonary artery systolic  pressure.   3. The mitral valve is normal in structure. Mild mitral valve  regurgitation. No evidence of mitral stenosis.   4. The aortic valve is normal in structure. Aortic valve regurgitation is  trivial. Mild aortic valve sclerosis is present, with no evidence of  aortic valve stenosis.   5. There is mild dilatation of the ascending aorta, measuring 36 mm.   6. The inferior vena cava is normal in size with greater than 50%  respiratory variability, suggesting right atrial pressure of 3 mmHg.   ASSESSMENT AND PLAN:  1.  Sick sinus syndrome: Status post Medtronic dual-chamber pacemaker.  Device functioning appropriately.  No changes at this time.  2.  Hypertension:well controlled  3.  Obstructive sleep apnea: CPAP compliance encouraged  4.  Atrial arrhythmias: Has had multiple arrhythmias, all have been short-lived.   Current medicines are reviewed at length with the patient today.   The patient does not have concerns regarding her medicines.  The following changes were made today: none  Labs/ tests ordered today include:  Orders Placed This Encounter  Procedures   EKG 12-Lead      Disposition:   FU with  Josejuan Hoaglin 12 months  Signed, Lakeva Hollon Meredith Leeds, MD  02/22/2021 2:36 PM     [facility] [address] [place] [address] [phone] (office) [phone] (fax)

## 2021-02-27 ENCOUNTER — Encounter: Payer: Medicare Other | Admitting: Cardiology

## 2021-02-28 ENCOUNTER — Other Ambulatory Visit: Payer: Self-pay | Admitting: Cardiology

## 2021-04-04 ENCOUNTER — Telehealth: Payer: Self-pay | Admitting: Cardiology

## 2021-04-04 NOTE — Telephone Encounter (Signed)
Spoke with patient, patient unable to get home monitor to come on, patient sure that monitor is plugged into outlet. Number to [employer] services given for patient to call tomorrow for troubleshooting or monitor replacement.

## 2021-04-04 NOTE — Telephone Encounter (Signed)
°  1. Has your device fired? no  2. Is you device beeping? no  3. Are you experiencing draining or swelling at device site? no  4. Are you calling to see if we received your device transmission? no  5. Have you passed out? no  Patient needs help sending a transmission.  Please route to Device Clinic Pool

## 2021-04-05 ENCOUNTER — Ambulatory Visit (INDEPENDENT_AMBULATORY_CARE_PROVIDER_SITE_OTHER): Payer: Medicare Other

## 2021-04-05 DIAGNOSIS — I495 Sick sinus syndrome: Secondary | ICD-10-CM | POA: Diagnosis not present

## 2021-04-05 LAB — CUP PACEART REMOTE DEVICE CHECK
Battery Impedance: 1470 Ohm
Battery Remaining Longevity: 50 mo
Battery Voltage: 2.76 V
Brady Statistic AP VP Percent: 7 %
Brady Statistic AP VS Percent: 82 %
Brady Statistic AS VP Percent: 1 %
Brady Statistic AS VS Percent: 11 %
Date Time Interrogation Session: 20230118085001
Implantable Lead Implant Date: 20131107
Implantable Lead Implant Date: 20131107
Implantable Lead Location: 753859
Implantable Lead Location: 753860
Implantable Lead Model: 4092
Implantable Lead Model: 5076
Implantable Pulse Generator Implant Date: 20131107
Lead Channel Impedance Value: 451 Ohm
Lead Channel Impedance Value: 625 Ohm
Lead Channel Pacing Threshold Amplitude: 0.5 V
Lead Channel Pacing Threshold Amplitude: 0.625 V
Lead Channel Pacing Threshold Pulse Width: 0.4 ms
Lead Channel Pacing Threshold Pulse Width: 0.4 ms
Lead Channel Setting Pacing Amplitude: 1.5 V
Lead Channel Setting Pacing Amplitude: 2 V
Lead Channel Setting Pacing Pulse Width: 0.4 ms
Lead Channel Setting Sensing Sensitivity: 1.4 mV

## 2021-04-18 NOTE — Progress Notes (Signed)
Remote pacemaker transmission.   

## 2021-06-08 ENCOUNTER — Encounter: Payer: Self-pay | Admitting: Cardiology

## 2021-06-08 ENCOUNTER — Other Ambulatory Visit: Payer: Self-pay

## 2021-06-08 ENCOUNTER — Ambulatory Visit (INDEPENDENT_AMBULATORY_CARE_PROVIDER_SITE_OTHER): Payer: Medicare Other | Admitting: Cardiology

## 2021-06-08 VITALS — BP 132/70 | HR 96 | Ht 65.6 in | Wt 189.4 lb

## 2021-06-08 DIAGNOSIS — I480 Paroxysmal atrial fibrillation: Secondary | ICD-10-CM | POA: Diagnosis not present

## 2021-06-08 DIAGNOSIS — Z950 Presence of cardiac pacemaker: Secondary | ICD-10-CM | POA: Diagnosis not present

## 2021-06-08 DIAGNOSIS — E7800 Pure hypercholesterolemia, unspecified: Secondary | ICD-10-CM

## 2021-06-08 DIAGNOSIS — I10 Essential (primary) hypertension: Secondary | ICD-10-CM | POA: Diagnosis not present

## 2021-06-08 NOTE — Patient Instructions (Signed)
Medication Instructions:  ?Your physician recommends that you continue on your current medications as directed. Please refer to the Current Medication list given to you today.  ?*If you need a refill on your cardiac medications before your next appointment, please call your pharmacy* ? ? ?Lab Work: ?None ?If you have labs (blood work) drawn today and your tests are completely normal, you will receive your results only by: ?MyChart Message (if you have MyChart) OR ?A paper copy in the mail ?If you have any lab test that is abnormal or we need to change your treatment, we will call you to review the results. ? ? ?Testing/Procedures: ?None Ordered ? ? ?Follow-Up: ?At [facility], you and your health needs are our priority.  As part of our continuing mission to provide you with exceptional heart care, we have created designated Provider Care Teams.  These Care Teams include your primary Cardiologist (physician) and Advanced Practice Providers (APPs -  Physician Assistants and Nurse Practitioners) who all work together to provide you with the care you need, when you need it. ? ?We recommend signing up for the patient portal called "MyChart".  Sign up information is provided on this After Visit Summary.  MyChart is used to connect with patients for Virtual Visits (Telemedicine).  Patients are able to view lab/test results, encounter notes, upcoming appointments, etc.  Non-urgent messages can be sent to your provider as well.   ?To learn more about what you can do with MyChart, go to [email or web address].   ? ?Your next appointment:   ?9 month(s) ? ?The format for your next appointment:   ?In Person ? ?Provider:   ?Jyl Heinz, MD{ ? ?  ? ? ?Other Instructions ?None  ?

## 2021-06-08 NOTE — Progress Notes (Signed)
?Cardiology Office Note:   ? ?Date:  06/08/2021  ? ?ID:  Denise Francis, DOB February 05, 1936, MRN [id number] ? ?PCP:  Olive Bass, MD  ?Cardiologist:  Garwin Brothers, MD  ? ?Referring MD: Olive Bass, MD  ? ? ?ASSESSMENT:   ? ?1. PAF (paroxysmal atrial fibrillation) (HCC)   ?2. Essential hypertension   ?3. Hypercholesterolemia   ?4. Presence of permanent cardiac pacemaker   ? ?PLAN:   ? ?In order of problems listed above: ? ?Primary prevention stressed to the patient.  Importance of compliance with diet medication stressed and she vocalized understanding and questions were answered to her satisfaction. ?Essential hypertension: Blood pressure stable and diet was emphasized. ?Mixed dyslipidemia: Lipids reviewed from [facility] sheet.  She is going to have blood work done in the next few days.  Diet emphasized. ?Permanent pacemaker: Stable function at this time.  Electrophysiology notes evaluated and reviewed. ?Patient will be seen in follow-up appointment in 9 months or earlier if the patient has any concerns ? ? ? ?Medication Adjustments/Labs and Tests Ordered: ?Current medicines are reviewed at length with the patient today.  Concerns regarding medicines are outlined above.  ?No orders of the defined types were placed in this encounter. ? ?No orders of the defined types were placed in this encounter. ? ? ? ?No chief complaint on file. ?  ? ?History of Present Illness:   ? ?Denise Francis is a 86 y.o. female.  Patient has past medical history of paroxysmal atrial fibrillation, essential hypertension, dyslipidemia.  She denies any problems at this time and takes care of activities of daily living.  She has a permanent pacemaker followed by our electrophysiology colleagues.  At the time of my evaluation, the patient is alert awake oriented and in no distress. ? ?Past Medical History:  ?Diagnosis Date  ? Acquired unequal limb length 09/04/2018  ? Acute cystitis without hematuria 02/15/2017  ? Acute right ankle pain  10/23/2018  ? Formatting of this note might be different from the original. 2020  ? Allergic rhinitis 04/07/2016  ? Altered gait 09/04/2018  ? Altered sensation due to recent stroke 08/25/2018  ? 2020  ? Anemia, iron deficiency 04/07/2016  ? Arthritis 09/27/2015  ? Arthropathy   ? nos, multiple sites with polymyalgia rheumatica  ? Artificial cardiac pacemaker 09/15/2014  ? Overview:  [employer]tronic   ? Asthma   ? Asthma in adult, mild persistent, uncomplicated 05/21/2016  ? Bradycardia 02/02/2015  ? Cardiac pacemaker in situ 02/10/2020  ? Cerebral artery occlusion with cerebral infarction (HCC) 08/01/2018  ? Cervical radiculitis 03/21/2016  ? Chronic pain of right knee 10/30/2012  ? Overview:  2017: onset 2017: ORTHO  ? CKD (chronic kidney disease), stage III (HCC) 07/05/2015  ? CRI (chronic renal insufficiency), stage 3 (moderate) (HCC)   ? Diarrhea 12/11/2018  ? Formatting of this note might be different from the original. 2020  ? Diverticulitis of colon with hemorrhage   ? Dysarthria 08/01/2018  ? Edema of right lower leg 10/23/2018  ? Formatting of this note might be different from the original. 2020  ? Essential hypertension 09/15/2014  ? GERD (gastroesophageal reflux disease)   ? Gout   ? H/O iron deficiency anemia   ? Hand discomfort 04/23/2013  ? Hemiplegia affecting right dominant side (HCC) 08/25/2018  ? 2020  ? History of asthma 08/01/2018  ? History of depression 09/06/2015  ? Formatting of this note might be different from the original. 2017:  02/02/2021: resolved  ?  Hypercholesterolemia 09/15/2014  ? Hyperglycemia 08/27/2017  ? Hyperlipidemia   ? Hyperparathyroidism due to renal insufficiency (HCC) 08/12/2015  ? Hypertension   ? Impaired mobility and activities of daily living 08/01/2018  ? Insomnia 08/12/2015  ? 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 Mixed dyslipidemia 08/20/2017  ? Moderate single current episode of major depressive disorder (HCC) 09/06/2015  ? Formatting of this note might be different from the original. 2017:  ? Orthostatic  hypotension 02/10/2020  ? OSA on CPAP 08/12/2015  ? Osteoporosis 08/12/2015  ? PAF (paroxysmal atrial fibrillation) (HCC) 02/25/2020  ? Pain in the groin, left 01/09/2018  ? 2019  ? Pain of both wrist joints 04/17/2016  ? Formatting of this note might be different from the original. 2017: onset 2017: ORTHO  ? PMR (polymyalgia rheumatica) (HCC) 04/30/2016  ? Polio 04/07/2016  ? Prediabetes 08/27/2017  ? Formatting of this note might be different from the original. 2020: 110/6.3  ? Presence of permanent cardiac pacemaker 08/20/2017  ? Pulmonary hypertension (HCC) 09/15/2014  ? Rectal bleeding 01/04/2017  ? Overview:  2018  ? Recurrent major depressive disorder, in remission (HCC) 09/06/2015  ? RLS (restless legs syndrome) 08/12/2015  ? Screening for diabetes mellitus (DM) 03/03/2018  ? Sick sinus syndrome (HCC) 08/12/2015  ? Spinal stenosis of lumbar region with neurogenic claudication 01/05/2016  ? Steatorrhea 05/14/2018  ? 2020  ? Stroke [facility](HCC)   ? Jul 27 2018  ? Suspected COVID-19 virus infection 03/21/2020  ? Formatting of this note might be different from the original. 03/21/2020  ? Type 2 diabetes mellitus without complication, without long-term current use of insulin (HCC) 08/27/2017  ? Formatting of this note might be different from the original. 2020: 110/6.3 2021: 107/6.1 02/02/2021: 6.5  ? UTI symptoms 03/14/2020  ? Vitamin D deficiency 03/25/2019  ? ? ?Past Surgical History:  ?Procedure Laterality Date  ? BACK SURGERY    ? x2  ? COLONOSCOPY  08/16/2010  ? Attempted. Moderate-to-severe sigmoid diverticulosis. Otherwise grossly normal attempted colonoscopy up to 40 cm (The patient had very poor preparation)  ? DENTAL SURGERY  12/24/2019  ? all teeth removed and a plate being placed in   ? ESOPHAGOGASTRODUODENOSCOPY  12/20/2010  ? Small hiatal hernia. Mild gastritis  ? KNEE SURGERY    ? WRIST SURGERY    ? ? ?Current Medications: ?Current Meds  ?Medication Sig  ? atorvastatin (LIPITOR) 40 MG tablet Take 1 tablet (40 mg total) by  mouth daily.  ? cetirizine (ZYRTEC) 10 MG tablet Take 10 mg by mouth daily.   ? chlorthalidone (HYGROTON) 25 MG tablet Take 1/2 tablet (12.5 mg) by mouth daily / MUST KEEP APPOINTMENT FOR FUTURE REFILL  ? clopidogrel (PLAVIX) 75 MG tablet Take 75 mg by mouth daily.  ? ezetimibe (ZETIA) 10 MG tablet Take 10 mg by mouth daily.  ? famotidine (PEPCID) 40 MG tablet Take 40 mg by mouth daily.  ? FIBER PO Take by mouth. 2 teaspoon every morning with coffee  ? fluticasone (FLONASE) 50 MCG/ACT nasal spray Place 2 sprays into both nostrils daily.   ? gabapentin (NEURONTIN) 300 MG capsule Take 300 mg daily by mouth.  ? hydrALAZINE (APRESOLINE) 25 MG tablet Take 25 mg by mouth as needed (systolic over 160).  ? montelukast (SINGULAIR) 10 MG tablet Take 10 mg by mouth at bedtime.   ? predniSONE (DELTASONE) 1 MG tablet Take 4 tablets by mouth daily.  ? rOPINIRole (REQUIP) 0.25 MG tablet Take 0.5 mg by mouth daily.  ?  traMADol (ULTRAM) 50 MG tablet Take 50 mg by mouth daily as needed for moderate pain.  ?  ? ?Allergies:   Amoxicillin, Clarithromycin, Flagyl [metronidazole], and Sulfamethoxazole-trimethoprim  ? ?Social History  ? ?Socioeconomic History  ? Marital status: Widowed  ?  Spouse name: Not on file  ? Number of children: Not on file  ? Years of education: Not on file  ? Highest education level: Not on file  ?Occupational History  ? Not on file  ?Tobacco Use  ? Smoking status: Never  ? Smokeless tobacco: Never  ?Vaping Use  ? Vaping Use: Never used  ?Substance and Sexual Activity  ? Alcohol use: Yes  ?  Comment: rare  ? Drug use: No  ? Sexual activity: Not on file  ?Other Topics Concern  ? Not on file  ?Social History Narrative  ? Not on file  ? ?Social Determinants of Health  ? ?Financial Resource Strain: Not on file  ?Food Insecurity: Not on file  ?Transportation Needs: Not on file  ?Physical Activity: Not on file  ?Stress: Not on file  ?Social Connections: Not on file  ?  ? ?Family History: ?The patient's family history  includes COPD in her mother; Heart attack in her mother; Heart failure in her father; Kidney failure in her father. There is no history of Colon cancer or Esophageal cancer. ? ?ROS:   ?Please see the histor

## 2021-06-13 ENCOUNTER — Other Ambulatory Visit: Payer: Self-pay

## 2021-06-13 MED ORDER — CHLORTHALIDONE 25 MG PO TABS: 12.5000 mg | ORAL_TABLET | Freq: Every day | ORAL | 3 refills | Status: DC

## 2021-07-05 ENCOUNTER — Ambulatory Visit (INDEPENDENT_AMBULATORY_CARE_PROVIDER_SITE_OTHER): Payer: Medicare Other

## 2021-07-05 DIAGNOSIS — I495 Sick sinus syndrome: Secondary | ICD-10-CM | POA: Diagnosis not present

## 2021-07-06 LAB — CUP PACEART REMOTE DEVICE CHECK
Battery Impedance: 1550 Ohm
Battery Remaining Longevity: 48 mo
Battery Voltage: 2.75 V
Brady Statistic AP VP Percent: 5 %
Brady Statistic AP VS Percent: 84 %
Brady Statistic AS VP Percent: 1 %
Brady Statistic AS VS Percent: 10 %
Date Time Interrogation Session: 20230420094631
Implantable Lead Implant Date: 20131107
Implantable Lead Implant Date: 20131107
Implantable Lead Location: 753859
Implantable Lead Location: 753860
Implantable Lead Model: 4092
Implantable Lead Model: 5076
Implantable Pulse Generator Implant Date: 20131107
Lead Channel Impedance Value: 451 Ohm
Lead Channel Impedance Value: 637 Ohm
Lead Channel Pacing Threshold Amplitude: 0.5 V
Lead Channel Pacing Threshold Amplitude: 0.625 V
Lead Channel Pacing Threshold Pulse Width: 0.4 ms
Lead Channel Pacing Threshold Pulse Width: 0.4 ms
Lead Channel Setting Pacing Amplitude: 1.5 V
Lead Channel Setting Pacing Amplitude: 2 V
Lead Channel Setting Pacing Pulse Width: 0.4 ms
Lead Channel Setting Sensing Sensitivity: 1.4 mV

## 2021-07-21 NOTE — Progress Notes (Signed)
Remote pacemaker transmission.   

## 2021-10-17 DIAGNOSIS — I69351 Hemiplegia and hemiparesis following cerebral infarction affecting right dominant side: Secondary | ICD-10-CM | POA: Insufficient documentation

## 2021-10-17 DIAGNOSIS — F321 Major depressive disorder, single episode, moderate: Secondary | ICD-10-CM | POA: Insufficient documentation

## 2021-10-17 HISTORY — DX: Major depressive disorder, single episode, moderate: F32.1

## 2021-10-17 HISTORY — DX: Hemiplegia and hemiparesis following cerebral infarction affecting right dominant side: I69.351

## 2022-04-02 ENCOUNTER — Other Ambulatory Visit: Payer: Self-pay

## 2022-04-02 ENCOUNTER — Ambulatory Visit: Payer: Medicare Other | Attending: Cardiology | Admitting: Cardiology

## 2022-04-02 VITALS — BP 152/86 | HR 84 | Ht 65.6 in | Wt 184.6 lb

## 2022-04-02 DIAGNOSIS — R269 Unspecified abnormalities of gait and mobility: Secondary | ICD-10-CM | POA: Diagnosis present

## 2022-04-02 DIAGNOSIS — G4733 Obstructive sleep apnea (adult) (pediatric): Secondary | ICD-10-CM | POA: Diagnosis not present

## 2022-04-02 DIAGNOSIS — I495 Sick sinus syndrome: Secondary | ICD-10-CM

## 2022-04-02 DIAGNOSIS — E782 Mixed hyperlipidemia: Secondary | ICD-10-CM | POA: Insufficient documentation

## 2022-04-02 DIAGNOSIS — I480 Paroxysmal atrial fibrillation: Secondary | ICD-10-CM

## 2022-04-02 DIAGNOSIS — I10 Essential (primary) hypertension: Secondary | ICD-10-CM | POA: Diagnosis not present

## 2022-04-02 NOTE — Progress Notes (Signed)
Cardiology Office Note:    Date:  04/02/2022   ID:  Denise Francis, DOB [birth year], MRN [id number]  PCP:  Olive Bass, MD  Cardiologist:  Garwin Brothers, MD   Referring MD: Olive Bass, MD    ASSESSMENT:    1. PAF (paroxysmal atrial fibrillation) (HCC)   2. Sick sinus syndrome (HCC)   3. Essential hypertension   4. OSA on CPAP   5. Mixed dyslipidemia   6. Altered gait    PLAN:    In order of problems listed above:  Primary prevention stressed to the patient.  Importance of compliance with diet medication stressed and she vocalized understanding.  She was advised to ambulate to the best of her ability. Sick sinus syndrome: Post permanent pacemaker: Stable at this time.  Pacemaker interrogation and evaluation reviewed and discussed with the patient at length. History of atrial arrhythmias: Managed by electrophysiology colleagues.  Patient is not on anticoagulation because of unstable gait.  Risks exceed benefits.  Again this is also addressed by electrophysiology. Essential hypertension: Blood pressure stable and diet was emphasized.  She has an element of whitecoat hypertension.  Her blood pressures at home are fine. Mixed dyslipidemia and obesity: She is on lipid-lowering medications.  She tells me that she is doing best to diet and exercise and lose weight.  Risks of obesity explained and she promises to do better. Patient will be seen in follow-up appointment in 6 months or earlier if the patient has any concerns    Medication Adjustments/Labs and Tests Ordered: Current medicines are reviewed at length with the patient today.  Concerns regarding medicines are outlined above.  Orders Placed This Encounter  Procedures   EKG 12-Lead   No orders of the defined types were placed in this encounter.    No chief complaint on file.    History of Present Illness:    Denise Francis is a 87 y.o. female.  Patient has past medical history of atrial arrhythmias,  essential hypertension, dyslipidemia and has a permanent pacemaker for sick sinus syndrome.  She denies any palpitations or any such issues.  No chest pain orthopnea or PND.  She is followed by electrophysiology colleagues.  Patient has unstable gait and ambulates with a walker and a cane at times.  At the time of my evaluation, the patient is alert awake oriented and in no distress.  Past Medical History:  Diagnosis Date   Acquired unequal limb length 09/04/2018   Acute cystitis without hematuria 02/15/2017   Acute right ankle pain 10/23/2018   Formatting of this note might be different from the original. 2020   Allergic rhinitis 04/07/2016   Altered gait 09/04/2018   Altered sensation due to recent stroke 08/25/2018   2020   Anemia, iron deficiency 04/07/2016   Arthritis 09/27/2015   Arthropathy    nos, multiple sites with polymyalgia rheumatica   Artificial cardiac pacemaker 09/15/2014   Overview:  Medtronic    Asthma    Asthma in adult, mild persistent, uncomplicated 05/21/2016   Bradycardia 02/02/2015   Cardiac pacemaker in situ 02/10/2020   Cerebral artery occlusion with cerebral infarction (HCC) 08/01/2018   Cervical radiculitis 03/21/2016   Chronic pain of right knee 10/30/2012   Overview:  2017: onset 2017: ORTHO   CKD (chronic kidney disease), stage III (HCC) 07/05/2015   CRI (chronic renal insufficiency), stage 3 (moderate) (HCC)    Current moderate episode of major depressive disorder without prior episode (HCC) 10/17/2021  Formatting of this note might be different from the original. 10/17/2021   Diarrhea 12/11/2018   Formatting of this note might be different from the original. 2020   Diverticulitis of colon with hemorrhage    Dysarthria 08/01/2018   Edema of right lower leg 10/23/2018   Formatting of this note might be different from the original. 2020   Essential hypertension 09/15/2014   GERD (gastroesophageal reflux disease)    Gout    H/O iron deficiency  anemia    Hand discomfort 04/23/2013   Hemiparesis of right dominant side as late effect of cerebral infarction (HCC) 10/17/2021   Formatting of this note might be different from the original. 2020: stroke with right hemiparesis   Hemiplegia affecting right dominant side (HCC) 08/25/2018   2020   History of asthma 08/01/2018   History of depression 09/06/2015   Formatting of this note might be different from the original. 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 2017:   Orthostatic hypotension 02/10/2020   OSA on CPAP 08/12/2015   Osteoporosis 08/12/2015   PAF (paroxysmal atrial fibrillation) (HCC) 02/25/2020   Pain in the groin, left 01/09/2018   2019   Pain of both wrist joints 04/17/2016   Formatting of this note might be different from the original. 2017: onset 2017: ORTHO   PMR (polymyalgia rheumatica) (HCC) 04/30/2016   Polio 04/07/2016   Prediabetes 08/27/2017   Formatting of this note might be different from the original. 2020: 110/6.3   Presence of permanent cardiac pacemaker 08/20/2017   Pulmonary hypertension (HCC) 09/15/2014   Rectal bleeding 01/04/2017   Overview:  2018   Recurrent major depressive disorder, in remission (HCC) 09/06/2015   RLS (restless legs syndrome) 08/12/2015   Screening for diabetes mellitus (DM) 03/03/2018   Sick sinus syndrome (HCC) 08/12/2015   Spinal stenosis of lumbar region with neurogenic claudication 01/05/2016   Stage 3b chronic kidney disease (CKD) (HCC) 07/05/2015   Steatorrhea 05/14/2018   2020    Stroke (HCC)    Jul 27 2018   Suspected COVID-19 virus infection 03/21/2020   Formatting of this note might be different from the original. 03/21/2020   Type 2 diabetes mellitus without complication, without long-term current use of insulin (HCC) 08/27/2017   Formatting of this note might be different from the original. 2020: 110/6.3 2021: 107/6.1 02/02/2021: 6.5   UTI symptoms 03/14/2020   Vitamin D deficiency 03/25/2019    Past Surgical History:  Procedure Laterality Date   BACK SURGERY     x2   COLONOSCOPY  08/16/2010   Attempted. Moderate-to-severe sigmoid diverticulosis. Otherwise grossly normal attempted colonoscopy up to 40 cm (The patient had very poor preparation)   DENTAL SURGERY  12/24/2019   all teeth removed and a plate being placed in    ESOPHAGOGASTRODUODENOSCOPY  12/20/2010   Small hiatal hernia. Mild gastritis   KNEE SURGERY     WRIST SURGERY      Current Medications: Current Meds  Medication Sig   atorvastatin (LIPITOR) 40 MG tablet Take 1 tablet (40 mg total) by mouth daily.   cetirizine (ZYRTEC)  10 MG tablet Take 10 mg by mouth daily.    chlorthalidone (HYGROTON) 25 MG tablet Take 0.5 tablets (12.5 mg total) by mouth daily.   clopidogrel (PLAVIX) 75 MG tablet Take 75 mg by mouth daily.   escitalopram (LEXAPRO) 10 MG tablet Take 10 mg by mouth daily.   ezetimibe (ZETIA) 10 MG tablet Take 10 mg by mouth daily.   famotidine (PEPCID) 40 MG tablet Take 40 mg by mouth daily.   FIBER PO Take by mouth. 2 teaspoon every morning with coffee   fluticasone (FLONASE) 50 MCG/ACT nasal spray Place 2 sprays into both nostrils daily.    gabapentin (NEURONTIN) 300 MG capsule Take 300 mg daily by mouth.   hydrALAZINE (APRESOLINE) 25 MG tablet Take 25 mg by mouth as needed (systolic over 161).   montelukast (SINGULAIR) 10 MG tablet Take 10 mg by mouth at bedtime.    predniSONE (DELTASONE) 1 MG tablet Take 4 tablets by mouth daily.   rOPINIRole (REQUIP) 0.25 MG tablet Take 0.5  mg by mouth daily.   traMADol (ULTRAM) 50 MG tablet Take 50 mg by mouth daily as needed for moderate pain.     Allergies:   Amoxicillin, Clarithromycin, Flagyl [metronidazole], and Sulfamethoxazole-trimethoprim   Social History   Socioeconomic History   Marital status: Widowed    Spouse name: Not on file   Number of children: Not on file   Years of education: Not on file   Highest education level: Not on file  Occupational History   Not on file  Tobacco Use   Smoking status: Never   Smokeless tobacco: Never  Vaping Use   Vaping Use: Never used  Substance and Sexual Activity   Alcohol use: Yes    Comment: rare   Drug use: No   Sexual activity: Not on file  Other Topics Concern   Not on file  Social History Narrative   Not on file   Social Determinants of Health   Financial Resource Strain: Not on file  Food Insecurity: Not on file  Transportation Needs: Not on file  Physical Activity: Not on file  Stress: Not on file  Social Connections: Not on file     Family History: The patient's family history includes COPD in her mother; Heart attack in her mother; Heart failure in her father; Kidney failure in her father. There is no history of Colon cancer or Esophageal cancer.  ROS:   Please see the history of present illness.    All other systems reviewed and are negative.  EKGs/Labs/Other Studies Reviewed:    The following studies were reviewed today: EKG reveals a paced rhythm.  Nonspecific ST-T changes   Recent Labs: No results found for requested labs within last 365 days.  Recent Lipid Panel No results found for: "CHOL", "TRIG", "HDL", "CHOLHDL", "VLDL", "LDLCALC", "LDLDIRECT"  Physical Exam:    VS:  BP (!) 152/86   Pulse 84   Ht 5' 5.6" (1.666 m)   Wt 184 lb 9.6 oz (83.7 kg)   SpO2 95%   BMI 30.16 kg/m     Wt Readings from Last 3 Encounters:  04/02/22 184 lb 9.6 oz (83.7 kg)  06/08/21 189 lb 6.4 oz (85.9 kg)  02/22/21 186 lb (84.4 kg)     GEN:  Patient is in no acute distress HEENT: Normal NECK: No JVD; No carotid bruits LYMPHATICS: No lymphadenopathy CARDIAC: Hear sounds regular, 2/6 systolic murmur at the apex. RESPIRATORY:  Clear to auscultation without rales, wheezing or rhonchi  ABDOMEN: Soft, non-tender, non-distended MUSCULOSKELETAL:  No edema; No deformity  SKIN: Warm and dry NEUROLOGIC:  Alert and oriented x 3 PSYCHIATRIC:  Normal affect   Signed, Jenean Lindau, MD  04/02/2022 2:17 PM    [facility]

## 2022-04-02 NOTE — Patient Instructions (Signed)

## 2022-04-04 ENCOUNTER — Ambulatory Visit (INDEPENDENT_AMBULATORY_CARE_PROVIDER_SITE_OTHER): Payer: Medicare Other

## 2022-04-04 DIAGNOSIS — I495 Sick sinus syndrome: Secondary | ICD-10-CM

## 2022-04-09 ENCOUNTER — Encounter: Payer: Self-pay | Admitting: Cardiology

## 2022-04-09 ENCOUNTER — Ambulatory Visit: Payer: Medicare Other | Attending: Cardiology | Admitting: Cardiology

## 2022-04-09 VITALS — BP 112/78 | HR 79 | Ht 65.5 in | Wt 186.0 lb

## 2022-04-09 DIAGNOSIS — I495 Sick sinus syndrome: Secondary | ICD-10-CM | POA: Insufficient documentation

## 2022-04-09 LAB — CUP PACEART INCLINIC DEVICE CHECK
Battery Impedance: 1804 Ohm
Battery Remaining Longevity: 42 mo
Battery Voltage: 2.75 V
Brady Statistic AP VP Percent: 6 %
Brady Statistic AP VS Percent: 83 %
Brady Statistic AS VP Percent: 1 %
Brady Statistic AS VS Percent: 11 %
Date Time Interrogation Session: 20240122170127
Implantable Lead Connection Status: 753985
Implantable Lead Connection Status: 753985
Implantable Lead Implant Date: 20131107
Implantable Lead Implant Date: 20131107
Implantable Lead Location: 753859
Implantable Lead Location: 753860
Implantable Lead Model: 4092
Implantable Lead Model: 5076
Implantable Pulse Generator Implant Date: 20131107
Lead Channel Impedance Value: 444 Ohm
Lead Channel Impedance Value: 635 Ohm
Lead Channel Pacing Threshold Amplitude: 0.5 V
Lead Channel Pacing Threshold Amplitude: 0.625 V
Lead Channel Pacing Threshold Pulse Width: 0.4 ms
Lead Channel Pacing Threshold Pulse Width: 0.4 ms
Lead Channel Sensing Intrinsic Amplitude: 4 mV
Lead Channel Sensing Intrinsic Amplitude: 5.6 mV
Lead Channel Setting Pacing Amplitude: 1.5 V
Lead Channel Setting Pacing Amplitude: 2 V
Lead Channel Setting Pacing Pulse Width: 0.4 ms
Lead Channel Setting Sensing Sensitivity: 1.4 mV
Zone Setting Status: 755011
Zone Setting Status: 755011

## 2022-04-09 LAB — CUP PACEART REMOTE DEVICE CHECK
Battery Impedance: 1760 Ohm
Battery Remaining Longevity: 42 mo
Battery Voltage: 2.75 V
Brady Statistic AP VP Percent: 6 %
Brady Statistic AP VS Percent: 83 %
Brady Statistic AS VP Percent: 1 %
Brady Statistic AS VS Percent: 11 %
Date Time Interrogation Session: 20240119142814
Implantable Lead Connection Status: 753985
Implantable Lead Connection Status: 753985
Implantable Lead Implant Date: 20131107
Implantable Lead Implant Date: 20131107
Implantable Lead Location: 753859
Implantable Lead Location: 753860
Implantable Lead Model: 4092
Implantable Lead Model: 5076
Implantable Pulse Generator Implant Date: 20131107
Lead Channel Impedance Value: 443 Ohm
Lead Channel Impedance Value: 628 Ohm
Lead Channel Pacing Threshold Amplitude: 0.5 V
Lead Channel Pacing Threshold Amplitude: 0.5 V
Lead Channel Pacing Threshold Pulse Width: 0.4 ms
Lead Channel Pacing Threshold Pulse Width: 0.4 ms
Lead Channel Setting Pacing Amplitude: 1.5 V
Lead Channel Setting Pacing Amplitude: 2 V
Lead Channel Setting Pacing Pulse Width: 0.4 ms
Lead Channel Setting Sensing Sensitivity: 1.4 mV
Zone Setting Status: 755011
Zone Setting Status: 755011

## 2022-04-09 NOTE — Progress Notes (Signed)
Electrophysiology Office Note   Date:  04/09/2022   ID:  Vail, Basista 1935-05-21, MRN [id number]  PCP:  Olive Bass, MD  Cardiologist:  Revankar Primary Electrophysiologist:  Jasim Harari Jorja Loa, MD    No chief complaint on file.     History of Present Illness: Denise Francis is a 87 y.o. female who is being seen today for the evaluation of pacemaker at the request of Glean Hess Revankar. Presenting today for electrophysiology evaluation.    She has a history significant for hypertension, pulmonary hypertension, polymyalgia rheumatica, sick sinus syndrome.  She is status post Medtronic dual-chamber pacemaker.  Today, denies symptoms of palpitations, chest pain, shortness of breath, orthopnea, PND, lower extremity edema, claudication, dizziness, presyncope, syncope, bleeding, or neurologic sequela. The patient is tolerating medications without difficulties.  Currently she feels well.  She has no chest pain or shortness of breath.  She is able to all of her daily activities.  It appears that she is having episodes of atrial fibrillation on her pacemaker.  Episodes are short, only a few hours at a time.  At this point, she would prefer to avoid changing from Plavix to anticoagulation..   Past Medical History:  Diagnosis Date   Acquired unequal limb length 09/04/2018   Acute cystitis without hematuria 02/15/2017   Acute right ankle pain 10/23/2018   Formatting of this note might be different from the original. 2020   Allergic rhinitis 04/07/2016   Altered gait 09/04/2018   Altered sensation due to recent stroke 08/25/2018   2020   Anemia, iron deficiency 04/07/2016   Arthritis 09/27/2015   Arthropathy    nos, multiple sites with polymyalgia rheumatica   Artificial cardiac pacemaker 09/15/2014   Overview:  Medtronic    Asthma    Asthma in adult, mild persistent, uncomplicated 05/21/2016   Bradycardia 02/02/2015   Cardiac pacemaker in situ 02/10/2020   Cerebral  artery occlusion with cerebral infarction (HCC) 08/01/2018   Cervical radiculitis 03/21/2016   Chronic pain of right knee 10/30/2012   Overview:  2017: onset 2017: ORTHO   CKD (chronic kidney disease), stage III (HCC) 07/05/2015   CRI (chronic renal insufficiency), stage 3 (moderate) (HCC)    Current moderate episode of major depressive disorder without prior episode (HCC) 10/17/2021   Formatting of this note might be different from the original. 10/17/2021   Diarrhea 12/11/2018   Formatting of this note might be different from the original. 2020   Diverticulitis of colon with hemorrhage    Dysarthria 08/01/2018   Edema of right lower leg 10/23/2018   Formatting of this note might be different from the original. 2020   Essential hypertension 09/15/2014   GERD (gastroesophageal reflux disease)    Gout    H/O iron deficiency anemia    Hand discomfort 04/23/2013   Hemiparesis of right dominant side as late effect of cerebral infarction (HCC) 10/17/2021   Formatting of this note might be different from the original. 2020: stroke with right hemiparesis   Hemiplegia affecting right dominant side (HCC) 08/25/2018   2020   History of asthma 08/01/2018   History of depression 09/06/2015   Formatting of this note might be different from the original. 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 2017:   Orthostatic hypotension 02/10/2020   OSA on CPAP 08/12/2015   Osteoporosis 08/12/2015   PAF (paroxysmal atrial fibrillation) ([place]) 02/25/2020   Pain in the  groin, left 01/09/2018   2019   Pain of both wrist joints 04/17/2016   Formatting of this note might be different from the original. 2017: onset 2017: ORTHO   PMR (polymyalgia rheumatica) ([place]) 04/30/2016   Polio 04/07/2016   Prediabetes 08/27/2017   Formatting of this note might be different from the original. 2020: 110/6.3   Presence of permanent cardiac pacemaker 08/20/2017   Pulmonary hypertension ([place]) 09/15/2014   Rectal bleeding 01/04/2017   Overview:  2018   Recurrent major depressive disorder, in remission ([place]) 09/06/2015   RLS (restless legs syndrome) 08/12/2015   Screening for diabetes mellitus (DM) 03/03/2018   Sick sinus syndrome ([place]) 08/12/2015   Spinal stenosis of lumbar region with neurogenic claudication 01/05/2016   Stage 3b chronic kidney disease (CKD) ([place]) 07/05/2015   Steatorrhea 05/14/2018   2020   Stroke ([place])    Jul 27 2018   Suspected COVID-19 virus infection 03/21/2020   Formatting of this note might be different from the original. 03/21/2020   Type 2 diabetes mellitus without complication, without long-term current use of insulin ([place]) 08/27/2017   Formatting of this note might be different from the original. 2020: 110/6.3 2021: 107/6.1 02/02/2021: 6.5   UTI symptoms 03/14/2020   Vitamin D deficiency 03/25/2019   Past Surgical History:  Procedure Laterality Date   BACK SURGERY     x2   COLONOSCOPY  08/16/2010   Attempted. Moderate-to-severe sigmoid diverticulosis. Otherwise grossly normal attempted colonoscopy up to 40 cm (The patient had very poor preparation)   DENTAL SURGERY  12/24/2019   all teeth removed and a plate being placed in    ESOPHAGOGASTRODUODENOSCOPY  12/20/2010   Small hiatal hernia. Mild gastritis   KNEE SURGERY     WRIST SURGERY       Current Outpatient Medications  Medication Sig Dispense Refill   atorvastatin (LIPITOR) 40 MG tablet Take 1 tablet (40 mg total) by mouth daily. 30 tablet 3   cetirizine (ZYRTEC) 10 MG tablet  Take 10 mg by mouth daily.      clopidogrel (PLAVIX) 75 MG tablet Take 75 mg by mouth daily.     escitalopram (LEXAPRO) 10 MG tablet Take 10 mg by mouth daily.     ezetimibe (ZETIA) 10 MG tablet Take 10 mg by mouth daily.     famotidine (PEPCID) 40 MG tablet Take 40 mg by mouth daily.     FIBER PO Take by mouth. 2 teaspoon every morning with coffee     fluticasone (FLONASE) 50 MCG/ACT nasal spray Place 2 sprays into both nostrils daily.      gabapentin (NEURONTIN) 300 MG capsule Take 300 mg daily by mouth.     hydrALAZINE (APRESOLINE) 25 MG tablet Take 12.5 mg by mouth as needed (systolic over 557).     montelukast (SINGULAIR) 10 MG tablet Take 10 mg by mouth at bedtime.      predniSONE (DELTASONE) 1 MG tablet Take 4 tablets by mouth daily.     rOPINIRole (REQUIP) 0.25 MG tablet Take 0.5 mg by mouth daily.     traMADol (ULTRAM) 50 MG tablet Take 50 mg by mouth daily as needed for moderate  pain.     No current facility-administered medications for this visit.    Allergies:   Amoxicillin, Clarithromycin, Flagyl [metronidazole], and Sulfamethoxazole-trimethoprim   Social History:  The patient  reports that she has never smoked. She has never used smokeless tobacco. She reports current alcohol use. She reports that she does not use drugs.   Family History:  The patient's family history includes COPD in her mother; Heart attack in her mother; Heart failure in her father; Kidney failure in her father.    ROS:  Please see the history of present illness.   Otherwise, review of systems is positive for none.   All other systems are reviewed and negative.   PHYSICAL EXAM: VS:  BP 112/78   Pulse 79   Ht 5' 5.5" (1.664 m)   Wt 186 lb (84.4 kg)   SpO2 97%   BMI 30.48 kg/m  , BMI Body mass index is 30.48 kg/m. GEN: Well nourished, well developed, in no acute distress  HEENT: normal  Neck: no JVD, carotid bruits, or masses Cardiac: RRR; no murmurs, rubs, or gallops,no edema  Respiratory:   clear to auscultation bilaterally, normal work of breathing GI: soft, nontender, nondistended, + BS MS: no deformity or atrophy  Skin: warm and dry Neuro:  Strength and sensation are intact Psych: euthymic mood, full affect  EKG:  EKG is not ordered today. Personal review of the ekg ordered 04/02/22 shows atrial paced   Recent Labs: No results found for requested labs within last 365 days.    Lipid Panel  No results found for: "CHOL", "TRIG", "HDL", "CHOLHDL", "VLDL", "LDLCALC", "LDLDIRECT"   Wt Readings from Last 3 Encounters:  04/09/22 186 lb (84.4 kg)  04/02/22 184 lb 9.6 oz (83.7 kg)  06/08/21 189 lb 6.4 oz (85.9 kg)      Other studies Reviewed: Additional studies/ records that were reviewed today include: Myoview 11/03/2020 Nuclear stress EF: 85%. There was no ST segment deviation noted during stress. The study is normal. This is a low risk study. The left ventricular ejection fraction is normal (55-65%).  TTE 11/11/2020  1. Left ventricular ejection fraction, by estimation, is 60 to 65%. The  left ventricle has normal function. The left ventricle has no regional  wall motion abnormalities. There is mild concentric left ventricular  hypertrophy. Left ventricular diastolic  parameters are consistent with Grade I diastolic dysfunction (impaired  relaxation). The average left ventricular global longitudinal strain is  -10.3 %. The global longitudinal strain is abnormal.   2. Right ventricular systolic function is mildly reduced. The right  ventricular size is normal. There is normal pulmonary artery systolic  pressure.   3. The mitral valve is normal in structure. Mild mitral valve  regurgitation. No evidence of mitral stenosis.   4. The aortic valve is normal in structure. Aortic valve regurgitation is  trivial. Mild aortic valve sclerosis is present, with no evidence of  aortic valve stenosis.   5. There is mild dilatation of the ascending aorta, measuring 36 mm.    6. The inferior vena cava is normal in size with greater than 50%  respiratory variability, suggesting right atrial pressure of 3 mmHg.   ASSESSMENT AND PLAN:  1.  Sick sinus syndrome: Status post Medtronic dual-chamber pacemaker.  Device functioning appropriately.  No changes at this time.  2.  Hypertension: Well-controlled  3.  Obstructive sleep apnea: CPAP compliance encouraged  4.  Paroxysmal atrial fibrillation: Has been having short episodes.  CHA2DS2-VASc of at least  5.  As she is having short episodes, and would prefer to be off of anticoagulation, Denise Francis continue her Plavix.  If episodes become more prolonged, she Denise Francis need anticoagulation.   Current medicines are reviewed at length with the patient today.   The patient does not have concerns regarding her medicines.  The following changes were made today: None  Labs/ tests ordered today include:  No orders of the defined types were placed in this encounter.     Disposition:   FU 12 months  Signed, Dominic Mahaney Jorja Loa, MD  04/09/2022 4:08 PM     [facility] [address] [place] [address] [phone] (office) [phone] (fax)

## 2022-04-09 NOTE — Patient Instructions (Signed)
Medication Instructions:  Your physician recommends that you continue on your current medications as directed. Please refer to the Current Medication list given to you today.  *If you need a refill on your cardiac medications before your next appointment, please call your pharmacy*   Lab Work: None ordered   Testing/Procedures: None ordered   Follow-Up: At [facility], you and your health needs are our priority.  As part of our continuing mission to provide you with exceptional heart care, we have created designated Provider Care Teams.  These Care Teams include your primary Cardiologist (physician) and Advanced Practice Providers (APPs -  Physician Assistants and Nurse Practitioners) who all work together to provide you with the care you need, when you need it.  We recommend signing up for the patient portal called "MyChart".  Sign up information is provided on this After Visit Summary.  MyChart is used to connect with patients for Virtual Visits (Telemedicine).  Patients are able to view lab/test results, encounter notes, upcoming appointments, etc.  Non-urgent messages can be sent to your provider as well.   To learn more about what you can do with MyChart, go to [email or web address].    Remote monitoring is used to monitor your Pacemaker or ICD from home. This monitoring reduces the number of office visits required to check your device to one time per year. It allows [place] to keep an eye on the functioning of your device to ensure it is working properly. You are scheduled for a device check from home on 07/04/2022. You may send your transmission at any time that day. If you have a wireless device, the transmission will be sent automatically. After your physician reviews your transmission, you will receive a postcard with your next transmission date.  Your next appointment:   1 year(s)  The format for your next appointment:   In Person  Provider:   Allegra Lai, MD    Thank you  for choosing [facility]!!   Trinidad Curet, RN [phone]    Other Instructions

## 2022-04-27 NOTE — Progress Notes (Signed)
Remote pacemaker transmission.   

## 2022-06-30 ENCOUNTER — Other Ambulatory Visit: Payer: Self-pay | Admitting: Cardiology

## 2022-07-04 ENCOUNTER — Other Ambulatory Visit: Payer: Self-pay | Admitting: Cardiology

## 2022-07-10 ENCOUNTER — Telehealth: Payer: Self-pay | Admitting: Cardiology

## 2022-07-10 MED ORDER — CHLORTHALIDONE 25 MG PO TABS: 12.5000 mg | ORAL_TABLET | Freq: Every day | ORAL | 3 refills | Status: DC

## 2022-07-10 NOTE — Telephone Encounter (Signed)
Pt c/o medication issue:  1. Name of Medication:    2. How are you currently taking this medication (dosage and times per day)?    3. Are you having a reaction (difficulty breathing--STAT)? no  4. What is your medication issue? Patient calling to get refill but medication is not listed under current medication. Please advise

## 2022-07-10 NOTE — Telephone Encounter (Signed)
Advised that Chlorthalidone was removed on her 04/09/22 visit as not taking. Pt reports that she has never stopped the medication. RX has been sent to pharmacy.

## 2022-08-09 ENCOUNTER — Telehealth: Payer: Self-pay | Admitting: Cardiology

## 2022-08-09 NOTE — Telephone Encounter (Signed)
Patient called again stating she is still waiting for a call back.  She does not understand what is taking so long to get a call back that she has been waiting since before 1pm.

## 2022-08-09 NOTE — Telephone Encounter (Signed)
Pt c/o BP issue: STAT if pt c/o blurred vision, one-sided weakness or slurred speech  1. What are your last 5 BP readings?  08/08/22 183/92  2. Are you having any other symptoms (ex. Dizziness, headache, blurred vision, passed out)? Extreme edema in feet  3. What is your BP issue? Hypertension   Has not taken BP today will take in 30 mins.

## 2022-08-15 NOTE — Telephone Encounter (Signed)
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 Pt has not taken the hydralazine when her BP has been elevated. Pt states that she ate chinese food on Saturday and again on Sunday evening and feels that might have something to do with her swelling. Advised to take the Hydralazine with elevated BP greater than 160, elevate her legs, wear compression hose and decrease sodium. If no improvement, let us know. Advised to get MyChart to avoid the aggravation of the phone center as she was very frustrated with not getting through. MyChart access has been sent via text.

## 2022-08-16 ENCOUNTER — Encounter: Payer: Self-pay | Admitting: Cardiology

## 2022-08-16 ENCOUNTER — Ambulatory Visit: Payer: Medicare Other | Attending: Cardiology | Admitting: Cardiology

## 2022-08-16 ENCOUNTER — Telehealth: Payer: Self-pay | Admitting: Cardiology

## 2022-08-16 VITALS — BP 150/80 | HR 64 | Ht 65.5 in | Wt 190.0 lb

## 2022-08-16 DIAGNOSIS — R0609 Other forms of dyspnea: Secondary | ICD-10-CM

## 2022-08-16 DIAGNOSIS — I10 Essential (primary) hypertension: Secondary | ICD-10-CM | POA: Diagnosis present

## 2022-08-16 DIAGNOSIS — R2243 Localized swelling, mass and lump, lower limb, bilateral: Secondary | ICD-10-CM | POA: Diagnosis present

## 2022-08-16 DIAGNOSIS — I480 Paroxysmal atrial fibrillation: Secondary | ICD-10-CM | POA: Diagnosis present

## 2022-08-16 DIAGNOSIS — I495 Sick sinus syndrome: Secondary | ICD-10-CM

## 2022-08-16 MED ORDER — AMLODIPINE BESYLATE 2.5 MG PO TABS: 2.5000 mg | ORAL_TABLET | Freq: Every day | ORAL | 3 refills | Status: DC

## 2022-08-16 MED ORDER — HYDRALAZINE HCL 25 MG PO TABS: 25.0000 mg | ORAL_TABLET | Freq: Every day | ORAL | 3 refills | Status: AC | PRN

## 2022-08-16 NOTE — Telephone Encounter (Signed)
Pt c/o BP issue: STAT if pt c/o blurred vision, one-sided weakness or slurred speech  1. What are your last 5 BP readings? 191/109 today 162/104 yesterday, 1182/110  2. Are you having any other symptoms (ex. Dizziness, headache, blurred vision, passed out)?   Feeling shaky  3. What is your BP issue? High blood pressure

## 2022-08-16 NOTE — Patient Instructions (Signed)
Medication Instructions:  Your physician has recommended you make the following change in your medication:   Start Amlodipine 2.5 mg daily  *If you need a refill on your cardiac medications before your next appointment, please call your pharmacy*   Lab Work: Your physician recommends that you have a BMP today.  If you have labs (blood work) drawn today and your tests are completely normal, you will receive your results only by: MyChart Message (if you have MyChart) OR A paper copy in the mail If you have any lab test that is abnormal or we need to change your treatment, we will call you to review the results.   Testing/Procedures: Your physician has requested that you have an echocardiogram. Echocardiography is a painless test that uses sound waves to create images of your heart. It provides your doctor with information about the size and shape of your heart and how well your heart's chambers and valves are working. This procedure takes approximately one hour. There are no restrictions for this procedure. Please do NOT wear cologne, perfume, aftershave, or lotions (deodorant is allowed). Please arrive 15 minutes prior to your appointment time.  Your physician has requested that you have a lower venous duplex. This test is an ultrasound of the veins in the legs. It looks at venous blood flow that carries blood from the heart to the legs. Allow one hour for a Lower Venous exam. There are no restrictions or special instructions.    Follow-Up: At [facility], you and your health needs are our priority.  As part of our continuing mission to provide you with exceptional heart care, we have created designated Provider Care Teams.  These Care Teams include your primary Cardiologist (physician) and Advanced Practice Providers (APPs -  Physician Assistants and Nurse Practitioners) who all work together to provide you with the care you need, when you need it.  We recommend signing up for the  patient portal called "MyChart".  Sign up information is provided on this After Visit Summary.  MyChart is used to connect with patients for Virtual Visits (Telemedicine).  Patients are able to view lab/test results, encounter notes, upcoming appointments, etc.  Non-urgent messages can be sent to your provider as well.   To learn more about what you can do with MyChart, go to [email or web address].    Your next appointment:   4 week(s)  The format for your next appointment:   In Person  Provider:   Belva Crome, MD   Other Instructions Echocardiogram An echocardiogram is a test that uses sound waves (ultrasound) to produce images of the heart. Images from an echocardiogram can provide important information about: Heart size and shape. The size and thickness and movement of your heart's walls. Heart muscle function and strength. Heart valve function or if you have stenosis. Stenosis is when the heart valves are too narrow. If blood is flowing backward through the heart valves (regurgitation). A tumor or infectious growth around the heart valves. Areas of heart muscle that are not working well because of poor blood flow or injury from a heart attack. Aneurysm detection. An aneurysm is a weak or damaged part of an artery wall. The wall bulges out from the normal force of blood pumping through the body. Tell a health care provider about: Any allergies you have. All medicines you are taking, including vitamins, herbs, eye drops, creams, and over-the-counter medicines. Any blood disorders you have. Any surgeries you have had. Any medical conditions you have. Whether you are  pregnant or may be pregnant. What are the risks? Generally, this is a safe test. However, problems may occur, including an allergic reaction to dye (contrast) that may be used during the test. What happens before the test? No specific preparation is needed. You may eat and drink normally. What happens during the  test? You will take off your clothes from the waist up and put on a hospital gown. Electrodes or electrocardiogram (ECG)patches may be placed on your chest. The electrodes or patches are then connected to a device that monitors your heart rate and rhythm. You will lie down on a table for an ultrasound exam. A gel will be applied to your chest to help sound waves pass through your skin. A handheld device, called a transducer, will be pressed against your chest and moved over your heart. The transducer produces sound waves that travel to your heart and bounce back (or "echo" back) to the transducer. These sound waves will be captured in real-time and changed into images of your heart that can be viewed on a video monitor. The images will be recorded on a computer and reviewed by your health care provider. You may be asked to change positions or hold your breath for a short time. This makes it easier to get different views or better views of your heart. In some cases, you may receive contrast through an IV in one of your veins. This can improve the quality of the pictures from your heart. The procedure may vary among health care providers and hospitals.   What can I expect after the test? You may return to your normal, everyday life, including diet, activities, and medicines, unless your health care provider tells you not to do that. Follow these instructions at home: It is up to you to get the results of your test. Ask your health care provider, or the department that is doing the test, when your results will be ready. Keep all follow-up visits. This is important. Summary An echocardiogram is a test that uses sound waves (ultrasound) to produce images of the heart. Images from an echocardiogram can provide important information about the size and shape of your heart, heart muscle function, heart valve function, and other possible heart problems. You do not need to do anything to prepare before this  test. You may eat and drink normally. After the echocardiogram is completed, you may return to your normal, everyday life, unless your health care provider tells you not to do that. This information is not intended to replace advice given to you by your health care provider. Make sure you discuss any questions you have with your health care provider. Document Revised: 10/27/2019 Document Reviewed: 10/27/2019 Elsevier Patient Education  2021 [employer]   Important Information About Sugar

## 2022-08-16 NOTE — Telephone Encounter (Signed)
Spoke with pt and asked her to come in this morning to see Wallis Bamberg, NP

## 2022-08-16 NOTE — Telephone Encounter (Signed)
Left VM to call back 

## 2022-08-16 NOTE — Progress Notes (Signed)
Cardiology Office Note:    Date:  08/16/2022   ID:  Denise Francis, DOB 1935/07/30, MRN [id number]  PCP:  Olive Bass, MD   [facility] Providers Cardiologist:  Garwin Brothers, MD Electrophysiologist:  Regan Lemming, MD     Referring MD: Olive Bass, MD   CC: follow up hypertension  History of Present Illness:    Denise Francis is a 87 y.o. female with a hx of paroxysmal atrial fibrillation, CVA, sick sinus syndrome s/p PPM, hypertension, OSA on CPAP, dyslipidemia, polymyalgia rheumatica.  She contacted our call center with concerns of elevated blood pressure reading and new pedal edema.  She presents today and states over the last few days her blood pressure has been elevated as high as 191/109.  This is accompanied by new pedal edema, and generally not feeling well.  She was instructed to take her hydralazine for systolic blood pressure greater than 160, she did do this for the last 2 days, today in our office her blood pressure is 140/80 and she states she is feeling better.  She does have some new pedal edema in both legs however slightly worse in her right leg, there is no redness, warmth, tenderness, appears to be related to excess volume. She denies chest pain, palpitations, dyspnea, pnd, orthopnea, n, v, dizziness, syncope, weight gain, or early satiety.   Past Medical History:  Diagnosis Date   Acquired unequal limb length 09/04/2018   Acute cystitis without hematuria 02/15/2017   Acute right ankle pain 10/23/2018   Formatting of this note might be different from the original. 2020   Allergic rhinitis 04/07/2016   Altered gait 09/04/2018   Altered sensation due to recent stroke 08/25/2018   2020   Anemia, iron deficiency 04/07/2016   Arthritis 09/27/2015   Arthropathy    nos, multiple sites with polymyalgia rheumatica   Artificial cardiac pacemaker 09/15/2014   Overview:  [employer]    Asthma    Asthma in adult, mild persistent,  uncomplicated 05/21/2016   Bradycardia 02/02/2015   Cardiac pacemaker in situ 02/10/2020   Cerebral artery occlusion with cerebral infarction (HCC) 08/01/2018   Cervical radiculitis 03/21/2016   Chronic pain of right knee 10/30/2012   Overview:  2017: onset 2017: ORTHO   CKD (chronic kidney disease), stage III (HCC) 07/05/2015   CRI (chronic renal insufficiency), stage 3 (moderate) (HCC)    Current moderate episode of major depressive disorder without prior episode (HCC) 10/17/2021   Formatting of this note might be different from the original. 10/17/2021   Diarrhea 12/11/2018   Formatting of this note might be different from the original. 2020   Diverticulitis of colon with hemorrhage    Dysarthria 08/01/2018   Edema of right lower leg 10/23/2018   Formatting of this note might be different from the original. 2020   Essential hypertension 09/15/2014   GERD (gastroesophageal reflux disease)    Gout    H/O iron deficiency anemia    Hand discomfort 04/23/2013   Hemiparesis of right dominant side as late effect of cerebral infarction (HCC) 10/17/2021   Formatting of this note might be different from the original. 2020: stroke with right hemiparesis   Hemiplegia affecting right dominant side (HCC) 08/25/2018   2020   History of asthma 08/01/2018   History of depression 09/06/2015   Formatting of this note might be different from the original. 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 2017:   Orthostatic hypotension 02/10/2020   OSA on CPAP  08/12/2015   Osteoporosis 08/12/2015   PAF (paroxysmal atrial fibrillation) (HCC) 02/25/2020   Pain in the groin, left 01/09/2018   2019   Pain of both wrist joints 04/17/2016   Formatting of this note might be different from the original. 2017: onset 2017: ORTHO   PMR (polymyalgia rheumatica) (HCC) 04/30/2016   Polio 04/07/2016   Prediabetes 08/27/2017   Formatting of this note might be different from the original. 2020: 110/6.3   Presence of permanent cardiac pacemaker 08/20/2017   Pulmonary hypertension (HCC) 09/15/2014   Rectal bleeding 01/04/2017   Overview:  2018   Recurrent major depressive disorder, in remission (HCC) 09/06/2015   RLS (restless legs syndrome) 08/12/2015   Screening for diabetes mellitus (DM) 03/03/2018   Sick sinus syndrome (HCC) 08/12/2015   Spinal stenosis of lumbar region with neurogenic claudication 01/05/2016   Stage 3b chronic kidney disease (CKD) (HCC) 07/05/2015   Steatorrhea 05/14/2018   2020   Stroke (HCC)    Jul 27 2018   Suspected COVID-19 virus infection 03/21/2020   Formatting of this note might be different from the original. 03/21/2020   Type 2 diabetes mellitus without complication, without long-term current use of insulin (HCC) 08/27/2017   Formatting of this note might be different from the original. 2020: 110/6.3 2021: 107/6.1 02/02/2021: 6.5   UTI symptoms 03/14/2020   Vitamin D deficiency 03/25/2019    Past Surgical History:  Procedure Laterality Date   BACK SURGERY     x2   COLONOSCOPY  08/16/2010   Attempted. Moderate-to-severe sigmoid diverticulosis. Otherwise grossly normal attempted colonoscopy up to 40 cm (The patient had very poor preparation)   DENTAL SURGERY  12/24/2019   all teeth removed and a plate being placed in    ESOPHAGOGASTRODUODENOSCOPY  12/20/2010   Small hiatal hernia. Mild gastritis   KNEE SURGERY     WRIST SURGERY      Current Medications: Current Meds  Medication Sig   amLODipine (NORVASC) 2.5 MG  tablet Take 1 tablet (2.5 mg total) by mouth daily.   atorvastatin (LIPITOR) 40 MG tablet Take 1 tablet (40 mg total) by mouth daily.   cetirizine (ZYRTEC) 10 MG tablet Take 10 mg by mouth daily.    chlorthalidone (HYGROTON) 25 MG tablet Take 0.5 tablets (12.5 mg total) by mouth daily.   clopidogrel (PLAVIX) 75 MG tablet Take 75 mg by mouth daily.   ezetimibe (ZETIA) 10 MG tablet Take 10 mg by mouth daily.   famotidine (PEPCID) 40 MG tablet Take 40 mg by mouth daily.   FIBER PO Take by mouth. 2 teaspoon every morning with coffee   fluticasone (FLONASE) 50 MCG/ACT nasal spray Place 2 sprays into both nostrils daily.    gabapentin (NEURONTIN) 300 MG capsule Take 300 mg daily by mouth.   montelukast (SINGULAIR) 10 MG tablet Take 10 mg by mouth at bedtime.    predniSONE (DELTASONE) 1 MG tablet Take 4 tablets by mouth daily.   rOPINIRole (REQUIP) 0.25 MG tablet Take 0.5 mg by mouth daily.   [DISCONTINUED] hydrALAZINE (APRESOLINE) 25 MG tablet Take 25 mg  by mouth daily as needed (Systolic BP over 161).     Allergies:   Amoxicillin, Clarithromycin, Flagyl [metronidazole], and Sulfamethoxazole-trimethoprim   Social History   Socioeconomic History   Marital status: Widowed    Spouse name: Not on file   Number of children: Not on file   Years of education: Not on file   Highest education level: Not on file  Occupational History   Not on file  Tobacco Use   Smoking status: Never   Smokeless tobacco: Never  Vaping Use   Vaping Use: Never used  Substance and Sexual Activity   Alcohol use: Yes    Comment: rare   Drug use: No   Sexual activity: Not on file  Other Topics Concern   Not on file  Social History Narrative   Not on file   Social Determinants of Health   Financial Resource Strain: Not on file  Food Insecurity: Not on file  Transportation Needs: Not on file  Physical Activity: Not on file  Stress: Not on file  Social Connections: Not on file     Family History: The  patient's family history includes COPD in her mother; Heart attack in her mother; Heart failure in her father; Kidney failure in her father. There is no history of Colon cancer or Esophageal cancer.  ROS:   Please see the history of present illness.     All other systems reviewed and are negative.  EKGs/Labs/Other Studies Reviewed:    The following studies were reviewed today: Cardiac Studies & Procedures     STRESS TESTS  MYOCARDIAL PERFUSION IMAGING 11/03/2020  Narrative  Nuclear stress EF: 85%.  There was no ST segment deviation noted during stress.  The study is normal.  This is a low risk study.  The left ventricular ejection fraction is normal (55-65%).   ECHOCARDIOGRAM  ECHOCARDIOGRAM COMPLETE 11/11/2020  Narrative ECHOCARDIOGRAM REPORT    Patient Name:   DIAMONE KARRAKER Date of Exam: 11/11/2020 Medical Rec #:  [id number]          Height:       66.0 in Accession #:    [id number]         Weight:       181.0 lb Date of Birth:  [birth year]          BSA:          1.917 m Patient Age:    85 years           BP:           150/86 mmHg Patient Gender: F                  HR:           77 bpm. Exam Location:  [facility]  Procedure: 2D Echo  Indications:    Dyspnea R06.00  History:        Patient has no prior history of Echocardiogram examinations. Stroke, Arrythmias:Atrial Fibrillation; Risk Factors:Hypertension, Dyslipidemia and Prediabetes.  Sonographer:    Louie Boston RDCS Referring Phys: Rito Ehrlich [facility]  IMPRESSIONS   1. Left ventricular ejection fraction, by estimation, is 60 to 65%. The left ventricle has normal function. The left ventricle has no regional wall motion abnormalities. There is mild concentric left ventricular hypertrophy. Left ventricular diastolic parameters are consistent with Grade I diastolic dysfunction (impaired relaxation). The average left ventricular global longitudinal strain is -10.3 %. The global longitudinal strain is  abnormal. 2. Right ventricular systolic function  is mildly reduced. The right ventricular size is normal. There is normal pulmonary artery systolic pressure. 3. The mitral valve is normal in structure. Mild mitral valve regurgitation. No evidence of mitral stenosis. 4. The aortic valve is normal in structure. Aortic valve regurgitation is trivial. Mild aortic valve sclerosis is present, with no evidence of aortic valve stenosis. 5. There is mild dilatation of the ascending aorta, measuring 36 mm. 6. The inferior vena cava is normal in size with greater than 50% respiratory variability, suggesting right atrial pressure of 3 mmHg.  FINDINGS Left Ventricle: Left ventricular ejection fraction, by estimation, is 60 to 65%. The left ventricle has normal function. The left ventricle has no regional wall motion abnormalities. The average left ventricular global longitudinal strain is -10.3 %. The global longitudinal strain is abnormal. The left ventricular internal cavity size was normal in size. There is mild concentric left ventricular hypertrophy. Left ventricular diastolic parameters are consistent with Grade I diastolic dysfunction (impaired relaxation). Normal left ventricular filling pressure.  Right Ventricle: The right ventricular size is normal. No increase in right ventricular wall thickness. Right ventricular systolic function is mildly reduced. There is normal pulmonary artery systolic pressure. The tricuspid regurgitant velocity is 2.35 m/s, and with an assumed right atrial pressure of 3 mmHg, the estimated right ventricular systolic pressure is 25.1 mmHg.  Left Atrium: Left atrial size was normal in size.  Right Atrium: Right atrial size was normal in size.  Pericardium: There is no evidence of pericardial effusion.  Mitral Valve: The mitral valve is normal in structure. Mild mitral annular calcification. Mild mitral valve regurgitation. No evidence of mitral valve stenosis.  Tricuspid  Valve: The tricuspid valve is normal in structure. Tricuspid valve regurgitation is mild . No evidence of tricuspid stenosis.  Aortic Valve: The aortic valve is normal in structure. Aortic valve regurgitation is trivial. Mild aortic valve sclerosis is present, with no evidence of aortic valve stenosis.  Pulmonic Valve: The pulmonic valve was normal in structure. Pulmonic valve regurgitation is not visualized. No evidence of pulmonic stenosis.  Aorta: The aortic root is normal in size and structure and the aortic arch was not well visualized. There is mild dilatation of the ascending aorta, measuring 36 mm.  Venous: The pulmonary veins were not well visualized. The inferior vena cava is normal in size with greater than 50% respiratory variability, suggesting right atrial pressure of 3 mmHg.  IAS/Shunts: No atrial level shunt detected by color flow Doppler.  Additional Comments: A device lead is visualized in the right ventricle and right atrium.   LEFT VENTRICLE PLAX 2D LVIDd:         4.40 cm  Diastology LVIDs:         3.10 cm  LV e' medial:    5.77 cm/s LV PW:         1.30 cm  LV E/e' medial:  13.7 LV IVS:        1.30 cm  LV e' lateral:   8.92 cm/s LVOT diam:     2.00 cm  LV E/e' lateral: 8.8 LV SV:         63 LV SV Index:   33       2D Longitudinal Strain LVOT Area:     3.14 cm 2D Strain GLS Avg:     -10.3 %   RIGHT VENTRICLE            IVC RV S prime:     6.42 cm/s  IVC diam:  1.00 cm TAPSE (M-mode): 1.9 cm  LEFT ATRIUM             Index       RIGHT ATRIUM           Index LA diam:        3.60 cm 1.88 cm/m  RA Area:     13.30 cm LA Vol (A2C):   50.8 ml 26.50 ml/m RA Volume:   31.20 ml  16.28 ml/m LA Vol (A4C):   37.7 ml 19.67 ml/m LA Biplane Vol: 44.1 ml 23.01 ml/m AORTIC VALVE LVOT Vmax:   92.70 cm/s LVOT Vmean:  66.800 cm/s LVOT VTI:    0.200 m  AORTA Ao Root diam: 2.70 cm Ao Asc diam:  3.60 cm Ao Desc diam: 2.50 cm  MITRAL VALVE                TRICUSPID  VALVE MV Area (PHT): 4.39 cm     TR Peak grad:   22.1 mmHg MV Decel Time: 173 msec     TR Vmax:        235.00 cm/s MV E velocity: 78.80 cm/s MV A velocity: 101.00 cm/s  SHUNTS MV E/A ratio:  0.78         Systemic VTI:  0.20 m Systemic Diam: 2.00 cm  Norman Herrlich MD Electronically signed by Norman Herrlich MD Signature Date/Time: 11/11/2020/12:04:41 PM    Final              EKG:  EKG is not  ordered today.    Recent Labs: No results found for requested labs within last 365 days.  Recent Lipid Panel No results found for: "CHOL", "TRIG", "HDL", "CHOLHDL", "VLDL", "LDLCALC", "LDLDIRECT"   Risk Assessment/Calculations:    CHA2DS2-VASc Score = 6   This indicates a 9.7% annual risk of stroke. The patient's score is based upon: CHF History: 0 HTN History: 1 Diabetes History: 0 Stroke History: 2 Vascular Disease History: 0 Age Score: 2 Gender Score: 1     HYPERTENSION CONTROL Vitals:   08/16/22 1105 08/16/22 1223  BP: (!) 140/80 (!) 150/80    The patient's blood pressure is elevated above target today.  In order to address the patient's elevated BP: Blood pressure will be monitored at home to determine if medication changes need to be made.; A current anti-hypertensive medication was adjusted today.            Physical Exam:    VS:  BP (!) 150/80 Comment: home reading  Pulse 64   Ht 5' 5.5" (1.664 m)   Wt 190 lb (86.2 kg)   SpO2 96%   BMI 31.14 kg/m     Wt Readings from Last 3 Encounters:  08/16/22 190 lb (86.2 kg)  04/09/22 186 lb (84.4 kg)  04/02/22 184 lb 9.6 oz (83.7 kg)     GEN:  Well nourished, well developed in no acute distress HEENT: Normal NECK: No JVD; No carotid bruits LYMPHATICS: No lymphadenopathy CARDIAC: RRR, no murmurs, rubs, gallops RESPIRATORY:  Clear to auscultation without rales, wheezing or rhonchi  ABDOMEN: Soft, non-tender, non-distended MUSCULOSKELETAL:  +2 pitting edema R > L; No deformity;  no erythema, no warmth SKIN: Warm  and dry NEUROLOGIC:  Alert and oriented x 3 PSYCHIATRIC:  Normal affect   ASSESSMENT:    1. Essential hypertension   2. DOE (dyspnea on exertion)   3. Localized swelling of both lower legs   4. Sick sinus syndrome (HCC)   5. PAF (  paroxysmal atrial fibrillation) (HCC)    PLAN:    In order of problems listed above:  Hypertension-blood pressure is 140/80 in the office today which is much better than it has been at home.  She has been taking in hydralazine the last 2 days.  She reports her blood pressure log and she has several blood pressure readings that are very elevated.  Will start her on amlodipine 2.5 mg daily.  Continue chlorthalidone 12.5 mg daily.  Continue hydralazine 25 mg as needed for systolic blood pressure reading greater than 160.  Will check BMET today. DOE/localized swelling of both lower legs-most recent echocardiogram in 2022 with EF 60 to 65%, grade 1 DD.  Will repeat echocardiogram as she now also has some seemingly new pedal edema.  Will also check lower extremity for DVT, although it appears to be related to volume overload, right is greater than left, she endorses some pain around her right knee but this appears to be orthopedic in nature. Paroxysmal atrial fibrillation-CHA2DS2-VASc score of 6, currently not on anticoagulation secondary to her preference.  She was evaluated by Dr. Elberta Fortis on 04/09/2022, remote check of her pacemaker revealed that she was having episodes of atrial fibrillation lasting for a few hours at a time however she was not interested in [facility], however the discussion occurred that should these episodes lengthen in duration this will need to be reconsidered. Sick sinus syndrome/PPM-followed by EP. OSA-compliance with CPAP encouraged.    Disposition-start amlodipine 2.5 mg daily, continue to keep a blood pressure journal.  Encouraged her to get MyChart so she could relay her blood pressure readings easier.  Will repeat echocardiogram.  Ultrasound to rule  out DVT.  Return in 4 weeks.       Medication Adjustments/Labs and Tests Ordered: Current medicines are reviewed at length with the patient today.  Concerns regarding medicines are outlined above.  Orders Placed This Encounter  Procedures   Basic metabolic panel   ECHOCARDIOGRAM COMPLETE   VAS [place] LOWER EXTREMITY VENOUS (DVT)   Meds ordered this encounter  Medications   amLODipine (NORVASC) 2.5 MG tablet    Sig: Take 1 tablet (2.5 mg total) by mouth daily.    Dispense:  180 tablet    Refill:  3   hydrALAZINE (APRESOLINE) 25 MG tablet    Sig: Take 1 tablet (25 mg total) by mouth daily as needed (Systolic BP over 161).    Dispense:  90 tablet    Refill:  3    Patient Instructions  Medication Instructions:  Your physician has recommended you make the following change in your medication:   Start Amlodipine 2.5 mg daily  *If you need a refill on your cardiac medications before your next appointment, please call your pharmacy*   Lab Work: Your physician recommends that you have a BMP today.  If you have labs (blood work) drawn today and your tests are completely normal, you will receive your results only by: MyChart Message (if you have MyChart) OR A paper copy in the mail If you have any lab test that is abnormal or we need to change your treatment, we will call you to review the results.   Testing/Procedures: Your physician has requested that you have an echocardiogram. Echocardiography is a painless test that uses sound waves to create images of your heart. It provides your doctor with information about the size and shape of your heart and how well your heart's chambers and valves are working. This procedure takes approximately one  hour. There are no restrictions for this procedure. Please do NOT wear cologne, perfume, aftershave, or lotions (deodorant is allowed). Please arrive 15 minutes prior to your appointment time.  Your physician has requested that you have a lower  venous duplex. This test is an ultrasound of the veins in the legs. It looks at venous blood flow that carries blood from the heart to the legs. Allow one hour for a Lower Venous exam. There are no restrictions or special instructions.    Follow-Up: At [facility], you and your health needs are our priority.  As part of our continuing mission to provide you with exceptional heart care, we have created designated Provider Care Teams.  These Care Teams include your primary Cardiologist (physician) and Advanced Practice Providers (APPs -  Physician Assistants and Nurse Practitioners) who all work together to provide you with the care you need, when you need it.  We recommend signing up for the patient portal called "MyChart".  Sign up information is provided on this After Visit Summary.  MyChart is used to connect with patients for Virtual Visits (Telemedicine).  Patients are able to view lab/test results, encounter notes, upcoming appointments, etc.  Non-urgent messages can be sent to your provider as well.   To learn more about what you can do with MyChart, go to [email or web address].    Your next appointment:   4 week(s)  The format for your next appointment:   In Person  Provider:   Belva Crome, MD   Other Instructions Echocardiogram An echocardiogram is a test that uses sound waves (ultrasound) to produce images of the heart. Images from an echocardiogram can provide important information about: Heart size and shape. The size and thickness and movement of your heart's walls. Heart muscle function and strength. Heart valve function or if you have stenosis. Stenosis is when the heart valves are too narrow. If blood is flowing backward through the heart valves (regurgitation). A tumor or infectious growth around the heart valves. Areas of heart muscle that are not working well because of poor blood flow or injury from a heart attack. Aneurysm detection. An aneurysm is a weak or  damaged part of an artery wall. The wall bulges out from the normal force of blood pumping through the body. Tell a health care provider about: Any allergies you have. All medicines you are taking, including vitamins, herbs, eye drops, creams, and over-the-counter medicines. Any blood disorders you have. Any surgeries you have had. Any medical conditions you have. Whether you are pregnant or may be pregnant. What are the risks? Generally, this is a safe test. However, problems may occur, including an allergic reaction to dye (contrast) that may be used during the test. What happens before the test? No specific preparation is needed. You may eat and drink normally. What happens during the test? You will take off your clothes from the waist up and put on a hospital gown. Electrodes or electrocardiogram (ECG)patches may be placed on your chest. The electrodes or patches are then connected to a device that monitors your heart rate and rhythm. You will lie down on a table for an ultrasound exam. A gel will be applied to your chest to help sound waves pass through your skin. A handheld device, called a transducer, will be pressed against your chest and moved over your heart. The transducer produces sound waves that travel to your heart and bounce back (or "echo" back) to the transducer. These sound waves will be captured in  real-time and changed into images of your heart that can be viewed on a video monitor. The images will be recorded on a computer and reviewed by your health care provider. You may be asked to change positions or hold your breath for a short time. This makes it easier to get different views or better views of your heart. In some cases, you may receive contrast through an IV in one of your veins. This can improve the quality of the pictures from your heart. The procedure may vary among health care providers and hospitals.   What can I expect after the test? You may return to your  normal, everyday life, including diet, activities, and medicines, unless your health care provider tells you not to do that. Follow these instructions at home: It is up to you to get the results of your test. Ask your health care provider, or the department that is doing the test, when your results will be ready. Keep all follow-up visits. This is important. Summary An echocardiogram is a test that uses sound waves (ultrasound) to produce images of the heart. Images from an echocardiogram can provide important information about the size and shape of your heart, heart muscle function, heart valve function, and other possible heart problems. You do not need to do anything to prepare before this test. You may eat and drink normally. After the echocardiogram is completed, you may return to your normal, everyday life, unless your health care provider tells you not to do that. This information is not intended to replace advice given to you by your health care provider. Make sure you discuss any questions you have with your health care provider. Document Revised: 10/27/2019 Document Reviewed: 10/27/2019 Elsevier Patient Education  2021 [employer]   Important Information About Sugar          Signed, Flossie Dibble, NP  08/16/2022 12:25 PM    [facility]

## 2022-08-17 LAB — BASIC METABOLIC PANEL WITH GFR
BUN/Creatinine Ratio: 20 (ref 12–28)
BUN: 23 mg/dL (ref 8–27)
CO2: 23 mmol/L (ref 20–29)
Calcium: 9.1 mg/dL (ref 8.7–10.3)
Chloride: 100 mmol/L (ref 96–106)
Creatinine, Ser: 1.13 mg/dL — ABNORMAL HIGH (ref 0.57–1.00)
Glucose: 135 mg/dL — ABNORMAL HIGH (ref 70–99)
Potassium: 3.9 mmol/L (ref 3.5–5.2)
Sodium: 140 mmol/L (ref 134–144)
eGFR: 48 mL/min/1.73 — ABNORMAL LOW

## 2022-08-27 ENCOUNTER — Other Ambulatory Visit: Payer: Self-pay

## 2022-08-27 ENCOUNTER — Telehealth: Payer: Self-pay | Admitting: Cardiology

## 2022-08-27 MED ORDER — AMLODIPINE BESYLATE 5 MG PO TABS: 5.0000 mg | ORAL_TABLET | Freq: Every day | ORAL | 3 refills | Status: DC

## 2022-08-27 NOTE — Telephone Encounter (Signed)
Called patient and she reported that her blood pressure this past morning was elevated. At 11 AM her blood pressure was 171/105 and she had taken her blood pressure medication at !0: 00 AM. She took her blood pressure while we were on the phone and it was 148/91 with a pulse of 78. She also stated that she took one of her PRN hydralazine's recently. She also reported that her lower extremity edema had become more worse than it had been and now her right lower leg was beginning to hurt. Please advise.

## 2022-08-27 NOTE — Telephone Encounter (Signed)
Called patient and informed her of Wallis Bamberg, NP's recommendation below:  "Continue to take hydralazine if SBP > 160.  We are getting [place] of her legs d/t swelling concerns as well as an echocardiogram. If she feels this is worse than when I saw her, she may need to go to the emergency room.  If her BP is staying consistently > 140 systolic, she can increase her amlodipine to 5 mg daily."  Patient verbalized understanding and had no further questions at this time.

## 2022-08-27 NOTE — Telephone Encounter (Signed)
Patient is calling back for update.

## 2022-08-27 NOTE — Telephone Encounter (Signed)
  Pt c/o BP issue: STAT if pt c/o blurred vision, one-sided weakness or slurred speech  1. What are your last 5 BP readings? 171/105  2. Are you having any other symptoms (ex. Dizziness, headache, blurred vision, passed out)? No symptoms  3. What is your BP issue? Pt said, her  is very high right now, she is just sitting on her couch. She took her hydrochlorothiazide and her BP is still high

## 2022-09-11 ENCOUNTER — Ambulatory Visit (INDEPENDENT_AMBULATORY_CARE_PROVIDER_SITE_OTHER): Payer: Medicare Other

## 2022-09-11 ENCOUNTER — Ambulatory Visit: Payer: Medicare Other | Attending: Cardiology

## 2022-09-11 DIAGNOSIS — R2243 Localized swelling, mass and lump, lower limb, bilateral: Secondary | ICD-10-CM | POA: Diagnosis not present

## 2022-09-11 DIAGNOSIS — R0609 Other forms of dyspnea: Secondary | ICD-10-CM

## 2022-09-11 LAB — ECHOCARDIOGRAM COMPLETE
P 1/2 time: 660 ms
S' Lateral: 2.5 cm

## 2022-09-12 NOTE — Progress Notes (Signed)
 " Cardiology Office Note:  .   Date:  09/13/2022  ID:  Denise Francis, DOB Oct 10, 1935, MRN [id number] PCP: Ofilia Lamar CROME, MD  [facility] Providers Cardiologist:  Laia Wiley JONELLE Crape, MD Electrophysiologist:  Soyla Gladis Norton, MD    History of Present Illness: .   Denise Francis is a 87 y.o. female with a hx of paroxysmal atrial fibrillation not on anticoagulation due to unstable gait, CVA, sick sinus syndrome s/p PPM, hypertension, OSA on CPAP, dyslipidemia, polymyalgia rheumatica.   Recently evaluated in office on 08/16/2022 With concerns of elevated blood pressure readings and new pedal edema, her blood pressure in our office was 140/80.  She was started on amlodipine  2.5 mg daily and advised to continue to check her blood pressure.  She was concerned with the edema in her legs possibly being a blood clot, we arranged for an ultrasound of her lower extremities which was negative for DVT.  Echocardiogram was arranged and completed on 09/11/2022  which revealed an EF of 55 to 60%, moderate concentric LVH, grade 1 DD.  Labs revealed creatinine 1.13, potassium 3.9, sodium 140.  She presents today for follow-up of her hypertension and pedal edema.  She says she is feeling much better.  Her weight is down 7 pounds from when she was last in our office.  Her blood pressure is well-controlled at 130/80.  She still has some pedal edema but it is better. She denies chest pain, palpitations, dyspnea, pnd, orthopnea, n, v, dizziness, syncope, weight gain, or early satiety.   ROS: Review of Systems  Constitutional: Negative.   HENT: Negative.    Eyes: Negative.   Respiratory: Negative.    Cardiovascular:  Positive for leg swelling. Negative for chest pain, palpitations, orthopnea, claudication and PND.  Gastrointestinal: Negative.   Genitourinary: Negative.   Musculoskeletal:  Positive for joint pain.  Skin: Negative.   Neurological: Negative.   Endo/Heme/Allergies:  Bruises/bleeds  easily.  Psychiatric/Behavioral: Negative.       Studies Reviewed: .        Cardiac Studies & Procedures     STRESS TESTS  MYOCARDIAL PERFUSION IMAGING 11/03/2020  Narrative  Nuclear stress EF: 85%.  There was no ST segment deviation noted during stress.  The study is normal.  This is a low risk study.  The left ventricular ejection fraction is normal (55-65%).   ECHOCARDIOGRAM  ECHOCARDIOGRAM COMPLETE 09/11/2022  Narrative ECHOCARDIOGRAM REPORT    Patient Name:   Denise Francis Date of Exam: 09/11/2022 Medical Rec #:  [id number]          Height:       65.5 in Accession #:    [id number]         Weight:       190.0 lb Date of Birth:  [birth year]          BSA:          1.947 m Patient Age:    86 years           BP:           150/80 mmHg Patient Gender: F                  HR:           83 bpm. Exam Location:  [facility]  Procedure: 2D Echo, Cardiac Doppler, Color Doppler and Strain Analysis  Indications:    Dyspnea R06.00  History:        Patient has prior  history of Echocardiogram examinations, most recent 11/11/2020. Arrythmias:Atrial Fibrillation, Signs/Symptoms:Edema and Fatigue; Risk Factors:Pulmonary hypertension and Diabetes.  Sonographer:    Lynwood Silvas RDCS Referring Phys: [phone] DELON BROCKS Paulino Cork  IMPRESSIONS   1. Left ventricular ejection fraction, by estimation, is 55 to 60%. The left ventricle has normal function. The left ventricle has no regional wall motion abnormalities. There is moderate concentric left ventricular hypertrophy. Left ventricular diastolic parameters are consistent with Grade I diastolic dysfunction (impaired relaxation). The average left ventricular global longitudinal strain is -13.1 %. The global longitudinal strain is abnormal. 2. Right ventricular systolic function is mildly reduced. The right ventricular size is normal. There is normal pulmonary artery systolic pressure. 3. The mitral valve is normal in structure. No evidence of  mitral valve regurgitation. No evidence of mitral stenosis. 4. The aortic valve is tricuspid. Aortic valve regurgitation is mild. Aortic valve sclerosis is present, with no evidence of aortic valve stenosis. 5. Aortic DTA is NWV. 6. The inferior vena cava is normal in size with greater than 50% respiratory variability, suggesting right atrial pressure of 3 mmHg.  FINDINGS Left Ventricle: Left ventricular ejection fraction, by estimation, is 55 to 60%. The left ventricle has normal function. The left ventricle has no regional wall motion abnormalities. The average left ventricular global longitudinal strain is -13.1 %. The global longitudinal strain is abnormal. The left ventricular internal cavity size was normal in size. There is moderate concentric left ventricular hypertrophy. Left ventricular diastolic parameters are consistent with Grade I diastolic dysfunction (impaired relaxation). Indeterminate filling pressures.  Right Ventricle: The right ventricular size is normal. No increase in right ventricular wall thickness. Right ventricular systolic function is mildly reduced. There is normal pulmonary artery systolic pressure. The tricuspid regurgitant velocity is 2.83 m/s, and with an assumed right atrial pressure of 3 mmHg, the estimated right ventricular systolic pressure is 35.0 mmHg.  Left Atrium: Left atrial size was normal in size.  Right Atrium: Right atrial size was normal in size.  Pericardium: There is no evidence of pericardial effusion.  Mitral Valve: The mitral valve is normal in structure. No evidence of mitral valve regurgitation. No evidence of mitral valve stenosis.  Tricuspid Valve: The tricuspid valve is normal in structure. Tricuspid valve regurgitation is mild . No evidence of tricuspid stenosis.  Aortic Valve: The aortic valve is tricuspid. Aortic valve regurgitation is mild. Aortic regurgitation PHT measures 660 msec. Aortic valve sclerosis is present, with no  evidence of aortic valve stenosis.  Pulmonic Valve: The pulmonic valve was normal in structure. Pulmonic valve regurgitation is not visualized. No evidence of pulmonic stenosis.  Aorta: The aortic arch was not well visualized, DTA is NWV and the aortic root and ascending aorta are structurally normal, with no evidence of dilitation.  Venous: The pulmonary veins were not well visualized. The inferior vena cava is normal in size with greater than 50% respiratory variability, suggesting right atrial pressure of 3 mmHg.  IAS/Shunts: No atrial level shunt detected by color flow Doppler.  Additional Comments: A device lead is visualized in the right atrium and right ventricle.   LEFT VENTRICLE PLAX 2D LVIDd:         3.60 cm   Diastology LVIDs:         2.50 cm   LV e' medial:    5.33 cm/s LV PW:         1.40 cm   LV E/e' medial:  15.0 LV IVS:        1.70  cm   LV e' lateral:   7.40 cm/s LVOT diam:     2.00 cm   LV E/e' lateral: 10.8 LV SV:         77 LV SV Index:   40        2D Longitudinal Strain LVOT Area:     3.14 cm  2D Strain GLS Avg:     -13.1 %   RIGHT VENTRICLE             IVC RV Basal diam:  2.30 cm     IVC diam: 1.40 cm RV S prime:     10.40 cm/s TAPSE (M-mode): 1.4 cm  LEFT ATRIUM             Index        RIGHT ATRIUM           Index LA diam:        4.10 cm 2.11 cm/m   RA Area:     10.30 cm LA Vol (A2C):   33.1 ml 17.00 ml/m  RA Volume:   20.60 ml  10.58 ml/m LA Vol (A4C):   31.1 ml 15.98 ml/m LA Biplane Vol: 34.4 ml 17.67 ml/m AORTIC VALVE LVOT Vmax:   133.67 cm/s LVOT Vmean:  84.233 cm/s LVOT VTI:    0.246 m AI PHT:      660 msec  AORTA Ao Root diam: 2.70 cm Ao Asc diam:  3.35 cm Ao Desc diam: 2.50 cm  MV E velocity: 80.20 cm/s  TRICUSPID VALVE MV A velocity: 95.40 cm/s  TR Peak grad:   32.0 mmHg MV E/A ratio:  0.84        TR Vmax:        283.00 cm/s  SHUNTS Systemic VTI:  0.25 m Systemic Diam: 2.00 cm  Redell Leiter MD Electronically signed by Redell Leiter MD Signature Date/Time: 09/11/2022/5:07:31 PM    Final             Risk Assessment/Calculations:    CHA2DS2-VASc Score = 6   This indicates a 9.7% annual risk of stroke. The patient's score is based upon: CHF History: 0 HTN History: 1 Diabetes History: 0 Stroke History: 2 Vascular Disease History: 0 Age Score: 2 Gender Score: 1            Physical Exam:   VS:  BP 130/80 (BP Location: Left Arm, Patient Position: Sitting, Cuff Size: Normal)   Pulse 82   Ht 5' 5 (1.651 m)   Wt 183 lb 12.8 oz (83.4 kg)   SpO2 96%   BMI 30.59 kg/m    Wt Readings from Last 3 Encounters:  09/13/22 183 lb 12.8 oz (83.4 kg)  08/16/22 190 lb (86.2 kg)  04/09/22 186 lb (84.4 kg)    GEN: Well nourished, well developed in no acute distress NECK: No JVD; No carotid bruits CARDIAC: RRR, no murmurs, rubs, gallops RESPIRATORY:  Clear to auscultation without rales, wheezing or rhonchi  ABDOMEN: Soft, non-tender, non-distended EXTREMITIES: Trace pedal edema right greater than left; No deformity   ASSESSMENT AND PLAN: .   PAF -decision was made to not anticoagulate secondary to unstable gait and patient's preference, CHA2DS2-VASc score of 6.   Hypertension -blood pressure is controlled today at 130/80, continue Norvasc  5 mg daily, continue Apresoline  25 mg as needed for systolic blood pressure greater than 160.  Shortness of breath -much improved, most recent echo revealed preserved EF  CVA - no deficits, on Plavix for stroke  prevention  Pedal edema -this is much improved, will give her low-dose Demadex  as needed for pedal edema.  Recent creatinine 1.13, potassium 3.9  Syndrome/presence of PPM - followed by EP        Dispo: Return in 3 months.   Signed, Delon JAYSON Hoover, NP  "

## 2022-09-13 ENCOUNTER — Encounter: Payer: Self-pay | Admitting: Cardiology

## 2022-09-13 ENCOUNTER — Ambulatory Visit: Payer: Medicare Other | Attending: Cardiology | Admitting: Cardiology

## 2022-09-13 VITALS — BP 130/80 | HR 82 | Ht 65.0 in | Wt 183.8 lb

## 2022-09-13 DIAGNOSIS — I480 Paroxysmal atrial fibrillation: Secondary | ICD-10-CM | POA: Diagnosis not present

## 2022-09-13 DIAGNOSIS — I639 Cerebral infarction, unspecified: Secondary | ICD-10-CM | POA: Diagnosis present

## 2022-09-13 DIAGNOSIS — R0609 Other forms of dyspnea: Secondary | ICD-10-CM | POA: Diagnosis not present

## 2022-09-13 DIAGNOSIS — I10 Essential (primary) hypertension: Secondary | ICD-10-CM | POA: Diagnosis present

## 2022-09-13 DIAGNOSIS — R2243 Localized swelling, mass and lump, lower limb, bilateral: Secondary | ICD-10-CM

## 2022-09-13 DIAGNOSIS — I495 Sick sinus syndrome: Secondary | ICD-10-CM | POA: Diagnosis not present

## 2022-09-13 MED ORDER — TORSEMIDE 10 MG PO TABS: 10.0000 mg | ORAL_TABLET | Freq: Every day | ORAL | 5 refills | Status: DC | PRN

## 2022-09-13 NOTE — Patient Instructions (Signed)
Medication Instructions:  Your physician has recommended you make the following change in your medication:  Start Torsemide 10 mg once daily as needed for swelling in feet and ankles  *If you need a refill on your cardiac medications before your next appointment, please call your pharmacy*   Lab Work: NONE If you have labs (blood work) drawn today and your tests are completely normal, you will receive your results only by: MyChart Message (if you have MyChart) OR A paper copy in the mail If you have any lab test that is abnormal or we need to change your treatment, we will call you to review the results.   Testing/Procedures: NONE   Follow-Up: At [facility], you and your health needs are our priority.  As part of our continuing mission to provide you with exceptional heart care, we have created designated Provider Care Teams.  These Care Teams include your primary Cardiologist (physician) and Advanced Practice Providers (APPs -  Physician Assistants and Nurse Practitioners) who all work together to provide you with the care you need, when you need it.  We recommend signing up for the patient portal called "MyChart".  Sign up information is provided on this After Visit Summary.  MyChart is used to connect with patients for Virtual Visits (Telemedicine).  Patients are able to view lab/test results, encounter notes, upcoming appointments, etc.  Non-urgent messages can be sent to your provider as well.   To learn more about what you can do with MyChart, go to [email or web address].    Your next appointment:   3 month(s)  Provider:   Wallis Bamberg, NP Rosalita Levan)    Other Instructions

## 2022-10-30 DIAGNOSIS — L98492 Non-pressure chronic ulcer of skin of other sites with fat layer exposed: Secondary | ICD-10-CM

## 2022-10-30 DIAGNOSIS — R233 Spontaneous ecchymoses: Secondary | ICD-10-CM

## 2022-10-30 HISTORY — DX: Spontaneous ecchymoses: R23.3

## 2022-10-30 HISTORY — DX: Non-pressure chronic ulcer of skin of other sites with fat layer exposed: L98.492

## 2022-11-28 ENCOUNTER — Telehealth (HOSPITAL_BASED_OUTPATIENT_CLINIC_OR_DEPARTMENT_OTHER): Payer: Self-pay | Admitting: *Deleted

## 2022-11-28 DIAGNOSIS — T148XXA Other injury of unspecified body region, initial encounter: Secondary | ICD-10-CM

## 2022-11-28 HISTORY — DX: Other injury of unspecified body region, initial encounter: T14.8XXA

## 2022-11-28 NOTE — Telephone Encounter (Signed)
This procedure should not require device clearance.  If device clearance needed please advise.

## 2022-11-28 NOTE — Telephone Encounter (Signed)
   Name: Denise Francis  DOB: 30-Apr-1935  MRN: [id number]  Primary Cardiologist: Garwin Brothers, MD   Preoperative team, please contact this patient and set up a phone call appointment for further preoperative risk assessment. Please obtain consent and complete medication review. Thank you for your help.  I confirm that guidance regarding antiplatelet and oral anticoagulation therapy has been completed and, if necessary, noted below.  Patient's Plavix is not prescribed by cardiology.  Patient with history of CVA.  Recommendations for holding Plavix will need to come from prescribing provider.  Ronney Asters, NP 11/28/2022, 1:06 PM [facility]

## 2022-11-28 NOTE — Telephone Encounter (Signed)
D/w pre op APP Edd Fabian, FNP who states defer clearance to Wallis Bamberg, NP at appt 12/14/22 with the pt.   I will update all partis involved.

## 2022-11-28 NOTE — Telephone Encounter (Signed)
   Pre-operative Risk Assessment    Patient Name: Denise Francis  DOB: January 12, 1936 MRN: [id number]      Request for Surgical Clearance    Procedure:   Lambert Mody Selectine debridement of left lower extremity wound.  Date of Surgery:  Clearance TBD                                 Surgeon:  Dr. Kendrick Fries Group or Practice Name:  [facility] Phone number:  [phone] Fax number:  [phone]   Type of Clearance Requested:   - Medical  - Pharmacy:  Hold Clopidogrel (Plavix) Not Indicated.   Type of Anesthesia:  General    Additional requests/questions:  Pt has upcoming appointment with Wallis Bamberg, NP on December 14, 2022.  Pre Op added to upcoming appt.   Signed, Emmit Pomfret   11/28/2022, 12:42 PM

## 2022-12-05 ENCOUNTER — Telehealth: Payer: Self-pay | Admitting: Cardiology

## 2022-12-05 MED ORDER — TORSEMIDE 20 MG PO TABS: 20.0000 mg | ORAL_TABLET | Freq: Every day | ORAL | 3 refills | Status: DC

## 2022-12-05 NOTE — Telephone Encounter (Signed)
Spoke with Surgeon office who is aware of schedule appointment  9-27 and will schedule accordingly after  Spoke with Chauncey Reading who is also aware of patient appointment and Surgery r/s She will contact patient and update them

## 2022-12-05 NOTE — Addendum Note (Signed)
Addended by: Eleonore Chiquito on: 12/05/2022 01:29 PM   Modules accepted: Orders

## 2022-12-05 NOTE — Telephone Encounter (Signed)
Recommendations reviewed with pt as per Dr. Revankar's note.  Pt verbalized understanding and had no additional questions.   

## 2022-12-05 NOTE — Progress Notes (Signed)
 " Cardiology Office Note:  .   Date:  12/06/2022  ID:  Denise Francis, DOB 1935-06-18, MRN [id number] PCP: Ofilia Lamar CROME, MD  [facility] Providers Cardiologist:  Jayle Solarz JONELLE Crape, MD Electrophysiologist:  Soyla Gladis Norton, MD    History of Present Illness: .   Denise Francis is a 87 y.o. female with a hx of paroxysmal atrial fibrillation not on anticoagulation due to unstable gait, CVA, sick sinus syndrome s/p PPM, hypertension, OSA on CPAP, dyslipidemia, polymyalgia rheumatica.   09/11/2022 echo EF 55-60%, moderate concentric LVH, grade I DD 09/11/22 VAS lower extremity US   negative for DVT 11/03/20 lexiscan  low risk normal study  Recently evaluated in office on 08/16/2022 With concerns of elevated blood pressure readings and new pedal edema, her blood pressure in our office was 140/80.  She was started on amlodipine  2.5 mg daily and advised to continue to check her blood pressure.  She was concerned with the edema in her legs possibly being a blood clot, we arranged for an ultrasound of her lower extremities which was negative for DVT.  Echocardiogram was arranged and completed on 09/11/2022  which revealed an EF of 55 to 60%, moderate concentric LVH, grade 1 DD.  Labs revealed creatinine 1.13, potassium 3.9, sodium 140.  Notified triage with concerns of pitting edema, her torsemide  was doubled on 12/05/2022.  She presents today for preoperative cardiovascular evaluation, she has been doing well from a cardiac perspective, offers no formal complaints.  She has a nonhealing wound on her left lower leg that she has been dealing with for a few months.  Was evaluated by surgery with plans to undergo debridement next week.  She also has home health coming to her home several times a week, for wound care.  She denies chest pain, palpitations, shortness of breath, dizziness, syncope, falls, hematochezia, hematuria, hemoptysis.  ROS: Review of Systems  Constitutional: Negative.   HENT:  Negative.    Eyes: Negative.   Respiratory: Negative.    Cardiovascular:  Positive for leg swelling. Negative for chest pain, palpitations, orthopnea, claudication and PND.  Gastrointestinal: Negative.   Genitourinary: Negative.   Musculoskeletal:  Positive for joint pain.  Skin: Negative.   Neurological: Negative.   Endo/Heme/Allergies:  Bruises/bleeds easily.  Psychiatric/Behavioral: Negative.       Studies Reviewed: .        Cardiac Studies & Procedures     STRESS TESTS  MYOCARDIAL PERFUSION IMAGING 11/03/2020  Narrative  Nuclear stress EF: 85%.  There was no ST segment deviation noted during stress.  The study is normal.  This is a low risk study.  The left ventricular ejection fraction is normal (55-65%).   ECHOCARDIOGRAM  ECHOCARDIOGRAM COMPLETE 09/11/2022  Narrative ECHOCARDIOGRAM REPORT    Patient Name:   KAYLA DESHAIES Date of Exam: 09/11/2022 Medical Rec #:  [id number]          Height:       65.5 in Accession #:    [id number]         Weight:       190.0 lb Date of Birth:  May 06, 1935          BSA:          1.947 m Patient Age:    86 years           BP:           150/80 mmHg Patient Gender: F  HR:           83 bpm. Exam Location:  [place]  Procedure: 2D Echo, Cardiac Doppler, Color Doppler and Strain Analysis  Indications:    Dyspnea R06.00  History:        Patient has prior history of Echocardiogram examinations, most recent 11/11/2020. Arrythmias:Atrial Fibrillation, Signs/Symptoms:Edema and Fatigue; Risk Factors:Pulmonary hypertension and Diabetes.  Sonographer:    Lynwood Silvas RDCS Referring Phys: [phone] DELON BROCKS Jenkins Risdon  IMPRESSIONS   1. Left ventricular ejection fraction, by estimation, is 55 to 60%. The left ventricle has normal function. The left ventricle has no regional wall motion abnormalities. There is moderate concentric left ventricular hypertrophy. Left ventricular diastolic parameters are consistent with Grade I  diastolic dysfunction (impaired relaxation). The average left ventricular global longitudinal strain is -13.1 %. The global longitudinal strain is abnormal. 2. Right ventricular systolic function is mildly reduced. The right ventricular size is normal. There is normal pulmonary artery systolic pressure. 3. The mitral valve is normal in structure. No evidence of mitral valve regurgitation. No evidence of mitral stenosis. 4. The aortic valve is tricuspid. Aortic valve regurgitation is mild. Aortic valve sclerosis is present, with no evidence of aortic valve stenosis. 5. Aortic DTA is NWV. 6. The inferior vena cava is normal in size with greater than 50% respiratory variability, suggesting right atrial pressure of 3 mmHg.  FINDINGS Left Ventricle: Left ventricular ejection fraction, by estimation, is 55 to 60%. The left ventricle has normal function. The left ventricle has no regional wall motion abnormalities. The average left ventricular global longitudinal strain is -13.1 %. The global longitudinal strain is abnormal. The left ventricular internal cavity size was normal in size. There is moderate concentric left ventricular hypertrophy. Left ventricular diastolic parameters are consistent with Grade I diastolic dysfunction (impaired relaxation). Indeterminate filling pressures.  Right Ventricle: The right ventricular size is normal. No increase in right ventricular wall thickness. Right ventricular systolic function is mildly reduced. There is normal pulmonary artery systolic pressure. The tricuspid regurgitant velocity is 2.83 m/s, and with an assumed right atrial pressure of 3 mmHg, the estimated right ventricular systolic pressure is 35.0 mmHg.  Left Atrium: Left atrial size was normal in size.  Right Atrium: Right atrial size was normal in size.  Pericardium: There is no evidence of pericardial effusion.  Mitral Valve: The mitral valve is normal in structure. No evidence of mitral valve  regurgitation. No evidence of mitral valve stenosis.  Tricuspid Valve: The tricuspid valve is normal in structure. Tricuspid valve regurgitation is mild . No evidence of tricuspid stenosis.  Aortic Valve: The aortic valve is tricuspid. Aortic valve regurgitation is mild. Aortic regurgitation PHT measures 660 msec. Aortic valve sclerosis is present, with no evidence of aortic valve stenosis.  Pulmonic Valve: The pulmonic valve was normal in structure. Pulmonic valve regurgitation is not visualized. No evidence of pulmonic stenosis.  Aorta: The aortic arch was not well visualized, DTA is NWV and the aortic root and ascending aorta are structurally normal, with no evidence of dilitation.  Venous: The pulmonary veins were not well visualized. The inferior vena cava is normal in size with greater than 50% respiratory variability, suggesting right atrial pressure of 3 mmHg.  IAS/Shunts: No atrial level shunt detected by color flow Doppler.  Additional Comments: A device lead is visualized in the right atrium and right ventricle.   LEFT VENTRICLE PLAX 2D LVIDd:         3.60 cm   Diastology LVIDs:  2.50 cm   LV e' medial:    5.33 cm/s LV PW:         1.40 cm   LV E/e' medial:  15.0 LV IVS:        1.70 cm   LV e' lateral:   7.40 cm/s LVOT diam:     2.00 cm   LV E/e' lateral: 10.8 LV SV:         77 LV SV Index:   40        2D Longitudinal Strain LVOT Area:     3.14 cm  2D Strain GLS Avg:     -13.1 %   RIGHT VENTRICLE             IVC RV Basal diam:  2.30 cm     IVC diam: 1.40 cm RV S prime:     10.40 cm/s TAPSE (M-mode): 1.4 cm  LEFT ATRIUM             Index        RIGHT ATRIUM           Index LA diam:        4.10 cm 2.11 cm/m   RA Area:     10.30 cm LA Vol (A2C):   33.1 ml 17.00 ml/m  RA Volume:   20.60 ml  10.58 ml/m LA Vol (A4C):   31.1 ml 15.98 ml/m LA Biplane Vol: 34.4 ml 17.67 ml/m AORTIC VALVE LVOT Vmax:   133.67 cm/s LVOT Vmean:  84.233 cm/s LVOT VTI:    0.246  m AI PHT:      660 msec  AORTA Ao Root diam: 2.70 cm Ao Asc diam:  3.35 cm Ao Desc diam: 2.50 cm  MV E velocity: 80.20 cm/s  TRICUSPID VALVE MV A velocity: 95.40 cm/s  TR Peak grad:   32.0 mmHg MV E/A ratio:  0.84        TR Vmax:        283.00 cm/s  SHUNTS Systemic VTI:  0.25 m Systemic Diam: 2.00 cm  Redell Leiter MD Electronically signed by Redell Leiter MD Signature Date/Time: 09/11/2022/5:07:31 PM    Final             Risk Assessment/Calculations:    CHA2DS2-VASc Score = 6   This indicates a 9.7% annual risk of stroke. The patient's score is based upon: CHF History: 0 HTN History: 1 Diabetes History: 0 Stroke History: 2 Vascular Disease History: 0 Age Score: 2 Gender Score: 1            Physical Exam:   VS:  BP 128/68 Comment: home bp reading  Pulse 83   Ht 5' 5 (1.651 m)   Wt 182 lb (82.6 kg)   SpO2 92%   BMI 30.29 kg/m    Wt Readings from Last 3 Encounters:  12/06/22 182 lb (82.6 kg)  09/13/22 183 lb 12.8 oz (83.4 kg)  08/16/22 190 lb (86.2 kg)    GEN: Well nourished, well developed in no acute distress NECK: No JVD; No carotid bruits CARDIAC: RRR, no murmurs, rubs, gallops RESPIRATORY:  Clear to auscultation without rales, wheezing or rhonchi  ABDOMEN: Soft, non-tender, non-distended EXTREMITIES: Trace pedal edema right greater than left; No deformity   ASSESSMENT AND PLAN: .   PAF/hypercoagulable state-decision was made to not anticoagulate secondary to unstable gait and patient's preference, CHA2DS2-VASc score of 6.    Hypertension -blood pressure is controlled at 128/68, continue Norvasc  5 mg daily, continue chlorthalidone   12.5 mg daily, continue Apresoline  25 mg as needed for systolic blood pressure greater than 160.  Shortness of breath -much improved, most recent echo revealed preserved EF  CVA - no deficits, on Plavix for stroke prevention managed by her PCP-as she was already addressed holding her Plavix for upcoming procedure.  Pedal  edema -continue low-dose Demadex  at 10 mg as needed.  Recheck BMET today.  Sick sinus syndrome syndrome/presence of PPM -most recent device interrogation reviewed battery and lead parameters were stable, followed by EP  Preoperative cardiovascular evaluation- According to the Revised Cardiac Risk Index (RCRI), her Perioperative Risk of Major Cardiac Event is (%): 0.9 Her Functional Capacity in METs is: 4.4 according to the Duke Activity Status Index (DASI). Therefore, based on ACC/AHA guidelines, patient would be at acceptable risk for the planned procedure without further cardiovascular testing. I will route this recommendation to the requesting party via Epic fax function.  Regarding her Plavix, this is monitored by her PCP and patient reports that he is already provided recommendations for holding this to the surgeon.        Dispo: Return in 3 months.   Signed, Delon JAYSON Hoover, NP  "

## 2022-12-05 NOTE — Telephone Encounter (Signed)
Spoke with Denise Francis who states that the pt's BP was 140/80 today. Pt has been taking Torsemide 10 mg daily and has continued edema. Please advise

## 2022-12-05 NOTE — Telephone Encounter (Signed)
Caller Jill Side) is reporting patient's date for surgery has been scheduled for 9/24 and is following up on status of clearance.

## 2022-12-05 NOTE — Telephone Encounter (Signed)
Left vm to call back

## 2022-12-05 NOTE — Telephone Encounter (Signed)
Pt c/o swelling/edema: STAT if pt has developed SOB within 24 hours  If swelling, where is the swelling located?   Both legs and feet  How much weight have you gained and in what time span?   Unknown  Have you gained 2 pounds in a day or 5 pounds in a week?   Unknown  Do you have a log of your daily weights (if so, list)?   No  Are you currently taking a fluid pill?   Yes  Are you currently SOB?   No  Have you traveled recently in a car or plane for an extended period of time?   Caller Jill Side) stated patient is having pitting edema in both legs and feet and this is with compression stockings.  Caller wants to know if patient should increase her torsemide and next steps.

## 2022-12-06 ENCOUNTER — Ambulatory Visit: Payer: Medicare Other | Attending: Cardiology | Admitting: Cardiology

## 2022-12-06 ENCOUNTER — Encounter: Payer: Self-pay | Admitting: *Deleted

## 2022-12-06 ENCOUNTER — Encounter: Payer: Self-pay | Admitting: Cardiology

## 2022-12-06 VITALS — BP 128/68 | HR 83 | Ht 65.0 in | Wt 182.0 lb

## 2022-12-06 DIAGNOSIS — I639 Cerebral infarction, unspecified: Secondary | ICD-10-CM | POA: Diagnosis present

## 2022-12-06 DIAGNOSIS — Z01810 Encounter for preprocedural cardiovascular examination: Secondary | ICD-10-CM

## 2022-12-06 DIAGNOSIS — I10 Essential (primary) hypertension: Secondary | ICD-10-CM

## 2022-12-06 DIAGNOSIS — I495 Sick sinus syndrome: Secondary | ICD-10-CM

## 2022-12-06 DIAGNOSIS — R269 Unspecified abnormalities of gait and mobility: Secondary | ICD-10-CM

## 2022-12-06 DIAGNOSIS — R2243 Localized swelling, mass and lump, lower limb, bilateral: Secondary | ICD-10-CM

## 2022-12-06 DIAGNOSIS — Z950 Presence of cardiac pacemaker: Secondary | ICD-10-CM | POA: Diagnosis present

## 2022-12-06 DIAGNOSIS — Z79899 Other long term (current) drug therapy: Secondary | ICD-10-CM

## 2022-12-06 DIAGNOSIS — I480 Paroxysmal atrial fibrillation: Secondary | ICD-10-CM | POA: Diagnosis present

## 2022-12-06 NOTE — Patient Instructions (Signed)
Medication Instructions:  Your physician recommends that you continue on your current medications as directed. Please refer to the Current Medication list given to you today.  *If you need a refill on your cardiac medications before your next appointment, please call your pharmacy*   Lab Work: Your physician recommends that you return for lab work in: Today for CBC and BMP  If you have labs (blood work) drawn today and your tests are completely normal, you will receive your results only by: MyChart Message (if you have MyChart) OR A paper copy in the mail If you have any lab test that is abnormal or we need to change your treatment, we will call you to review the results.   Testing/Procedures: NONE   Follow-Up: At [facility], you and your health needs are our priority.  As part of our continuing mission to provide you with exceptional heart care, we have created designated Provider Care Teams.  These Care Teams include your primary Cardiologist (physician) and Advanced Practice Providers (APPs -  Physician Assistants and Nurse Practitioners) who all work together to provide you with the care you need, when you need it.  We recommend signing up for the patient portal called "MyChart".  Sign up information is provided on this After Visit Summary.  MyChart is used to connect with patients for Virtual Visits (Telemedicine).  Patients are able to view lab/test results, encounter notes, upcoming appointments, etc.  Non-urgent messages can be sent to your provider as well.   To learn more about what you can do with MyChart, go to [email or web address].    Your next appointment:   3 month(s)  Provider:   Wallis Bamberg, NP Rosalita Levan)    Other Instructions

## 2022-12-07 ENCOUNTER — Telehealth: Payer: Self-pay | Admitting: *Deleted

## 2022-12-07 DIAGNOSIS — Z79899 Other long term (current) drug therapy: Secondary | ICD-10-CM

## 2022-12-07 DIAGNOSIS — N183 Chronic kidney disease, stage 3 unspecified: Secondary | ICD-10-CM

## 2022-12-07 DIAGNOSIS — I480 Paroxysmal atrial fibrillation: Secondary | ICD-10-CM

## 2022-12-07 LAB — CBC WITH DIFFERENTIAL/PLATELET
Basophils Absolute: 0 x10E3/uL (ref 0.0–0.2)
Basos: 0 %
EOS (ABSOLUTE): 0 x10E3/uL (ref 0.0–0.4)
Eos: 0 %
Hematocrit: 39.5 % (ref 34.0–46.6)
Hemoglobin: 13.1 g/dL (ref 11.1–15.9)
Immature Grans (Abs): 0 x10E3/uL (ref 0.0–0.1)
Immature Granulocytes: 0 %
Lymphocytes Absolute: 1.3 x10E3/uL (ref 0.7–3.1)
Lymphs: 16 %
MCH: 32.3 pg (ref 26.6–33.0)
MCHC: 33.2 g/dL (ref 31.5–35.7)
MCV: 98 fL — ABNORMAL HIGH (ref 79–97)
Monocytes Absolute: 0.8 x10E3/uL (ref 0.1–0.9)
Monocytes: 10 %
Neutrophils Absolute: 6 x10E3/uL (ref 1.4–7.0)
Neutrophils: 74 %
Platelets: 319 x10E3/uL (ref 150–450)
RBC: 4.05 x10E6/uL (ref 3.77–5.28)
RDW: 13.1 % (ref 11.7–15.4)
WBC: 8.2 x10E3/uL (ref 3.4–10.8)

## 2022-12-07 LAB — BASIC METABOLIC PANEL WITH GFR
BUN/Creatinine Ratio: 18 (ref 12–28)
BUN: 28 mg/dL — ABNORMAL HIGH (ref 8–27)
CO2: 24 mmol/L (ref 20–29)
Calcium: 9.8 mg/dL (ref 8.7–10.3)
Chloride: 96 mmol/L (ref 96–106)
Creatinine, Ser: 1.52 mg/dL — ABNORMAL HIGH (ref 0.57–1.00)
Glucose: 173 mg/dL — ABNORMAL HIGH (ref 70–99)
Potassium: 3.3 mmol/L — ABNORMAL LOW (ref 3.5–5.2)
Sodium: 139 mmol/L (ref 134–144)
eGFR: 33 mL/min/1.73 — ABNORMAL LOW

## 2022-12-07 MED ORDER — POTASSIUM CHLORIDE ER 10 MEQ PO TBCR: 10.0000 meq | EXTENDED_RELEASE_TABLET | ORAL | 3 refills | Status: DC

## 2022-12-07 MED ORDER — TORSEMIDE 10 MG PO TABS: 10.0000 mg | ORAL_TABLET | ORAL | 3 refills | Status: DC

## 2022-12-07 NOTE — Telephone Encounter (Signed)
Informed pt of lab results and sent medication to [facility], [place]. Pt verbalized understanding and had no further questions.

## 2022-12-07 NOTE — Telephone Encounter (Signed)
-----   Message from Flossie Dibble sent at 12/07/2022  9:39 AM EDT ----- Back down on your demadex to 20 mg daily.  Your potassium is low, start potassium chloride 10 mEq daily.  Return in 2 weeks for repeat BMET.

## 2022-12-14 ENCOUNTER — Ambulatory Visit: Payer: Medicare Other | Admitting: Cardiology

## 2023-01-11 ENCOUNTER — Encounter (HOSPITAL_COMMUNITY): Payer: Medicare Other

## 2023-01-22 DIAGNOSIS — R410 Disorientation, unspecified: Secondary | ICD-10-CM | POA: Insufficient documentation

## 2023-01-22 DIAGNOSIS — N179 Acute kidney failure, unspecified: Secondary | ICD-10-CM | POA: Insufficient documentation

## 2023-01-22 DIAGNOSIS — E876 Hypokalemia: Secondary | ICD-10-CM

## 2023-01-22 DIAGNOSIS — N390 Urinary tract infection, site not specified: Secondary | ICD-10-CM

## 2023-01-22 HISTORY — DX: Disorientation, unspecified: R41.0

## 2023-01-22 HISTORY — DX: Urinary tract infection, site not specified: N39.0

## 2023-01-22 HISTORY — DX: Hypokalemia: E87.6

## 2023-01-22 HISTORY — DX: Acute kidney failure, unspecified: N17.9

## 2023-02-20 ENCOUNTER — Telehealth: Payer: Self-pay | Admitting: *Deleted

## 2023-02-20 DIAGNOSIS — F419 Anxiety disorder, unspecified: Secondary | ICD-10-CM

## 2023-02-20 HISTORY — DX: Anxiety disorder, unspecified: F41.9

## 2023-02-20 NOTE — Telephone Encounter (Signed)
   Patient Name: Denise Francis  DOB: [birth year] MRN: [id number]  Primary Cardiologist: Garwin Brothers, MD  Chart reviewed as part of pre-operative protocol coverage. Patient has an upcoming CT myelogram of the lumbar spine planned and we were asked to give our recommendations for holding Plavix. However, patient is on Plavix for prior CVA and not for any cardiac reason. Therefore, will defer recommendations for holding this to PCP or Neurology.   I will route this recommendation to the requesting party via Epic fax function and remove from pre-op pool.  Please call with questions.  Corrin Parker, PA-C 02/20/2023, 11:25 AM

## 2023-02-20 NOTE — Telephone Encounter (Signed)
   Pre-operative Risk Assessment    Patient Name: Denise Francis  DOB: 09-24-1935 MRN: [id number]      Request for Surgical Clearance    Procedure:   CT Myelogram L Spine  Date of Surgery:  Clearance TBD                                 Surgeon:  CT Technician  Surgeon's Group or Practice Name:  [facility] Phone number:  [phone] Fax number:  [phone]   Type of Clearance Requested:   - Pharmacy:  Hold Clopidogrel (Plavix) 5 days   Type of Anesthesia:  Not Indicated   Additional requests/questions:   Contact Lucendia Herrlich at [phone], ext. [phone]  Signed, Stacie Glaze   02/20/2023, 11:02 AM

## 2023-03-05 NOTE — Progress Notes (Deleted)
Cardiology Office Note:  .   Date:  03/05/2023  ID:  Denise Francis, DOB [birth year], MRN [id number] PCP: Olive Bass, MD  [facility] Providers Cardiologist:  Garwin Brothers, MD Electrophysiologist:  Regan Lemming, MD    History of Present Illness: .   Denise Francis is a 87 y.o. female with a hx of paroxysmal atrial fibrillation not on anticoagulation due to unstable gait, CVA, sick sinus syndrome s/p PPM, hypertension, OSA on CPAP, dyslipidemia, polymyalgia rheumatica.   09/11/2022 echo EF 55-60%, moderate concentric LVH, grade I DD 09/11/22 VAS lower extremity US  negative for DVT 11/03/20 lexiscan low risk normal study  Recently evaluated in office on 08/16/2022 With concerns of elevated blood pressure readings and new pedal edema, her blood pressure in our office was 140/80.  She was started on amlodipine 2.5 mg daily and advised to continue to check her blood pressure.  She was concerned with the edema in her legs possibly being a blood clot, we arranged for an ultrasound of her lower extremities which was negative for DVT.  Echocardiogram was arranged and completed on 09/11/2022  which revealed an EF of 55 to 60%, moderate concentric LVH, grade 1 DD.  Labs revealed creatinine 1.13, potassium 3.9, sodium 140.  Notified by triage with concerns of pitting edema, her torsemide was doubled on 12/05/2022.  She presents today for preoperative cardiovascular evaluation, she has been doing well from a cardiac perspective, offers no formal complaints.  She has a nonhealing wound on her left lower leg that she has been dealing with for a few months.  Was evaluated by surgery with plans to undergo debridement next week.  She also has home health coming to her home several times a week, for wound care.  She denies chest pain, palpitations, shortness of breath, dizziness, syncope, falls, hematochezia, hematuria, hemoptysis.  ROS: Review of Systems  Constitutional: Negative.    HENT: Negative.    Eyes: Negative.   Respiratory: Negative.    Cardiovascular:  Positive for leg swelling. Negative for chest pain, palpitations, orthopnea, claudication and PND.  Gastrointestinal: Negative.   Genitourinary: Negative.   Musculoskeletal:  Positive for joint pain.  Skin: Negative.   Neurological: Negative.   Endo/Heme/Allergies:  Bruises/bleeds easily.  Psychiatric/Behavioral: Negative.       Studies Reviewed: .        Cardiac Studies & Procedures     STRESS TESTS  MYOCARDIAL PERFUSION IMAGING 11/03/2020  Narrative  Nuclear stress EF: 85%.  There was no ST segment deviation noted during stress.  The study is normal.  This is a low risk study.  The left ventricular ejection fraction is normal (55-65%).  ECHOCARDIOGRAM  ECHOCARDIOGRAM COMPLETE 09/11/2022  Narrative ECHOCARDIOGRAM REPORT    Patient Name:   Denise Francis Date of Exam: 09/11/2022 Medical Rec #:  [id number]          Height:       65.5 in Accession #:    [id number]         Weight:       190.0 lb Date of Birth:  1935-12-26          BSA:          1.947 m Patient Age:    86 years           BP:           150/80 mmHg Patient Gender: F  HR:           83 bpm. Exam Location:  [place]  Procedure: 2D Echo, Cardiac Doppler, Color Doppler and Strain Analysis  Indications:    Dyspnea R06.00  History:        Patient has prior history of Echocardiogram examinations, most recent 11/11/2020. Arrythmias:Atrial Fibrillation, Signs/Symptoms:Edema and Fatigue; Risk Factors:Pulmonary hypertension and Diabetes.  Sonographer:    Louie Boston RDCS Referring Phys: [phone] Darden Dates Tremeka Helbling  IMPRESSIONS   1. Left ventricular ejection fraction, by estimation, is 55 to 60%. The left ventricle has normal function. The left ventricle has no regional wall motion abnormalities. There is moderate concentric left ventricular hypertrophy. Left ventricular diastolic parameters are consistent with  Grade I diastolic dysfunction (impaired relaxation). The average left ventricular global longitudinal strain is -13.1 %. The global longitudinal strain is abnormal. 2. Right ventricular systolic function is mildly reduced. The right ventricular size is normal. There is normal pulmonary artery systolic pressure. 3. The mitral valve is normal in structure. No evidence of mitral valve regurgitation. No evidence of mitral stenosis. 4. The aortic valve is tricuspid. Aortic valve regurgitation is mild. Aortic valve sclerosis is present, with no evidence of aortic valve stenosis. 5. Aortic DTA is NWV. 6. The inferior vena cava is normal in size with greater than 50% respiratory variability, suggesting right atrial pressure of 3 mmHg.  FINDINGS Left Ventricle: Left ventricular ejection fraction, by estimation, is 55 to 60%. The left ventricle has normal function. The left ventricle has no regional wall motion abnormalities. The average left ventricular global longitudinal strain is -13.1 %. The global longitudinal strain is abnormal. The left ventricular internal cavity size was normal in size. There is moderate concentric left ventricular hypertrophy. Left ventricular diastolic parameters are consistent with Grade I diastolic dysfunction (impaired relaxation). Indeterminate filling pressures.  Right Ventricle: The right ventricular size is normal. No increase in right ventricular wall thickness. Right ventricular systolic function is mildly reduced. There is normal pulmonary artery systolic pressure. The tricuspid regurgitant velocity is 2.83 m/s, and with an assumed right atrial pressure of 3 mmHg, the estimated right ventricular systolic pressure is 35.0 mmHg.  Left Atrium: Left atrial size was normal in size.  Right Atrium: Right atrial size was normal in size.  Pericardium: There is no evidence of pericardial effusion.  Mitral Valve: The mitral valve is normal in structure. No evidence of mitral  valve regurgitation. No evidence of mitral valve stenosis.  Tricuspid Valve: The tricuspid valve is normal in structure. Tricuspid valve regurgitation is mild . No evidence of tricuspid stenosis.  Aortic Valve: The aortic valve is tricuspid. Aortic valve regurgitation is mild. Aortic regurgitation PHT measures 660 msec. Aortic valve sclerosis is present, with no evidence of aortic valve stenosis.  Pulmonic Valve: The pulmonic valve was normal in structure. Pulmonic valve regurgitation is not visualized. No evidence of pulmonic stenosis.  Aorta: The aortic arch was not well visualized, DTA is NWV and the aortic root and ascending aorta are structurally normal, with no evidence of dilitation.  Venous: The pulmonary veins were not well visualized. The inferior vena cava is normal in size with greater than 50% respiratory variability, suggesting right atrial pressure of 3 mmHg.  IAS/Shunts: No atrial level shunt detected by color flow Doppler.  Additional Comments: A device lead is visualized in the right atrium and right ventricle.   LEFT VENTRICLE PLAX 2D LVIDd:         3.60 cm   Diastology LVIDs:  2.50 cm   LV e' medial:    5.33 cm/s LV PW:         1.40 cm   LV E/e' medial:  15.0 LV IVS:        1.70 cm   LV e' lateral:   7.40 cm/s LVOT diam:     2.00 cm   LV E/e' lateral: 10.8 LV SV:         77 LV SV Index:   40        2D Longitudinal Strain LVOT Area:     3.14 cm  2D Strain GLS Avg:     -13.1 %   RIGHT VENTRICLE             IVC RV Basal diam:  2.30 cm     IVC diam: 1.40 cm RV S prime:     10.40 cm/s TAPSE (M-mode): 1.4 cm  LEFT ATRIUM             Index        RIGHT ATRIUM           Index LA diam:        4.10 cm 2.11 cm/m   RA Area:     10.30 cm LA Vol (A2C):   33.1 ml 17.00 ml/m  RA Volume:   20.60 ml  10.58 ml/m LA Vol (A4C):   31.1 ml 15.98 ml/m LA Biplane Vol: 34.4 ml 17.67 ml/m AORTIC VALVE LVOT Vmax:   133.67 cm/s LVOT Vmean:  84.233 cm/s LVOT VTI:     0.246 m AI PHT:      660 msec  AORTA Ao Root diam: 2.70 cm Ao Asc diam:  3.35 cm Ao Desc diam: 2.50 cm  MV E velocity: 80.20 cm/s  TRICUSPID VALVE MV A velocity: 95.40 cm/s  TR Peak grad:   32.0 mmHg MV E/A ratio:  0.84        TR Vmax:        283.00 cm/s  SHUNTS Systemic VTI:  0.25 m Systemic Diam: 2.00 cm  Norman Herrlich MD Electronically signed by Norman Herrlich MD Signature Date/Time: 09/11/2022/5:07:31 PM    Final             Risk Assessment/Calculations:    CHA2DS2-VASc Score = 6   This indicates a 9.7% annual risk of stroke. The patient's score is based upon: CHF History: 0 HTN History: 1 Diabetes History: 0 Stroke History: 2 Vascular Disease History: 0 Age Score: 2 Gender Score: 1   {This patient has a significant risk of stroke if diagnosed with atrial fibrillation.  Please consider VKA or DOAC agent for anticoagulation if the bleeding risk is acceptable.   You can also use the SmartPhrase .[email or web address] for documentation.   :[id number]} No BP recorded.  {Refresh Note OR Click here to enter BP  :1}***       Physical Exam:   VS:  There were no vitals taken for this visit.   Wt Readings from Last 3 Encounters:  12/06/22 182 lb (82.6 kg)  09/13/22 183 lb 12.8 oz (83.4 kg)  08/16/22 190 lb (86.2 kg)    GEN: Well nourished, well developed in no acute distress NECK: No JVD; No carotid bruits CARDIAC: RRR, no murmurs, rubs, gallops RESPIRATORY:  Clear to auscultation without rales, wheezing or rhonchi  ABDOMEN: Soft, non-tender, non-distended EXTREMITIES: Trace pedal edema right greater than left; No deformity   ASSESSMENT AND PLAN: .   PAF/hypercoagulable state-decision was made to not anticoagulate  secondary to unstable gait and patient's preference, CHA2DS2-VASc score of 6.    Hypertension -blood pressure is controlled at 128/68, continue Norvasc 5 mg daily, continue chlorthalidone 12.5 mg daily, continue Apresoline 25 mg as needed for systolic blood pressure  greater than 160.  Shortness of breath -much improved, most recent echo revealed preserved EF  CVA - no deficits, on Plavix for stroke prevention managed by her PCP-as she was already addressed holding her Plavix for upcoming procedure.  Pedal edema -continue low-dose Demadex at 10 mg as needed.  Recheck BMET today.  Sick sinus syndrome syndrome/presence of PPM -most recent device interrogation reviewed battery and lead parameters were stable, followed by EP  Preoperative cardiovascular evaluation- According to the Revised Cardiac Risk Index (RCRI), her   Her   according to the Duke Activity Status Index (DASI). Therefore, based on ACC/AHA guidelines, patient would be at acceptable risk for the planned procedure without further cardiovascular testing. I will route this recommendation to the requesting party via Epic fax function.  Regarding her Plavix, this is monitored by her PCP and patient reports that he is already provided recommendations for holding this to the surgeon.        Dispo: Return in 3 months.   Signed, Flossie Dibble, NP

## 2023-03-07 ENCOUNTER — Ambulatory Visit: Payer: Medicare Other | Admitting: Cardiology

## 2023-03-07 DIAGNOSIS — I480 Paroxysmal atrial fibrillation: Secondary | ICD-10-CM

## 2023-03-07 DIAGNOSIS — I10 Essential (primary) hypertension: Secondary | ICD-10-CM

## 2023-03-07 DIAGNOSIS — D6859 Other primary thrombophilia: Secondary | ICD-10-CM

## 2023-03-07 DIAGNOSIS — I495 Sick sinus syndrome: Secondary | ICD-10-CM

## 2023-03-07 DIAGNOSIS — I639 Cerebral infarction, unspecified: Secondary | ICD-10-CM

## 2023-03-07 DIAGNOSIS — Z950 Presence of cardiac pacemaker: Secondary | ICD-10-CM

## 2023-03-08 ENCOUNTER — Other Ambulatory Visit: Payer: Self-pay | Admitting: Physician Assistant

## 2023-03-08 DIAGNOSIS — M5416 Radiculopathy, lumbar region: Secondary | ICD-10-CM

## 2023-03-11 ENCOUNTER — Ambulatory Visit: Payer: Medicare Other | Admitting: Cardiology

## 2023-03-20 NOTE — Progress Notes (Signed)
 Cardiology Office Note:  .   Date:  03/21/2023  ID:  Denise Francis, DOB 1935/08/02, MRN [id number] PCP: Ofilia Lamar CROME, MD  [facility] Providers Cardiologist:  Lynzy Rawles JONELLE Crape, MD Electrophysiologist:  Soyla Gladis Norton, MD    History of Present Illness: .   Denise Francis is a 88 y.o. female with a hx of paroxysmal atrial fibrillation not on anticoagulation due to unstable gait, CVA, sick sinus syndrome s/p PPM, hypertension, OSA on CPAP, dyslipidemia, polymyalgia rheumatica.   09/11/2022 echo EF 55-60%, moderate concentric LVH, grade I DD 09/11/22 VAS lower extremity US   negative for DVT 04/15/2022 device interrogation, stable battery and lead parameters 11/03/20 lexiscan  low risk normal study  Recently evaluated in office on 08/16/2022 With concerns of elevated blood pressure readings and new pedal edema, her blood pressure in our office was 140/80.  She was started on amlodipine  2.5 mg daily and advised to continue to check her blood pressure.  She was concerned with the edema in her legs possibly being a blood clot, we arranged for an ultrasound of her lower extremities which was negative for DVT.  Echocardiogram was arranged and completed on 09/11/2022  which revealed an EF of 55 to 60%, moderate concentric LVH, grade 1 DD.  Labs revealed creatinine 1.13, potassium 3.9, sodium 140.  Notified by triage with concerns of pitting edema, her torsemide  was doubled on 12/05/2022.  She presents today accompanied by her daughter for follow-up of pedal edema.  She is apparently had a rough couple of months dealing with ongoing back pain, nonhealing wound on her leg, and a cognitive decline.  She ended up staying at [facility] for some time, is recently returned back home.  There was apparently some back-and-forth with her diuretic and potassium supplementation.  Most recently, her diuretic was stopped, her feet are quite edematous and she is very bothered by this. She denies chest pain,  palpitations, dyspnea, pnd, orthopnea, n, v, dizziness, syncope, weight gain, or early satiety.    ROS: Review of Systems  Constitutional: Negative.   HENT: Negative.    Eyes: Negative.   Respiratory: Negative.    Cardiovascular:  Positive for leg swelling. Negative for chest pain, palpitations, orthopnea, claudication and PND.  Gastrointestinal: Negative.   Genitourinary: Negative.   Musculoskeletal:  Positive for joint pain.  Skin: Negative.   Neurological: Negative.   Endo/Heme/Allergies:  Bruises/bleeds easily.  Psychiatric/Behavioral: Negative.       Studies Reviewed: SABRA   EKG Interpretation Date/Time:  Thursday March 21 2023 15:47:48 EST Ventricular Rate:  77 PR Interval:  228 QRS Duration:  70 QT Interval:  394 QTC Calculation: 445 R Axis:   -2  Text Interpretation: Atrial-paced rhythm with prolonged AV conduction with Premature atrial complexes -- no changes Minimal voltage criteria for LVH, may be normal variant Inferior infarct , age undetermined Cannot rule out Anterior infarct , age undetermined Abnormal ECG When compared with ECG of 24-Oct-2006 12:24, Electronic atrial pacemaker has replaced Sinus rhythm Inferior infarct is now Present Nonspecific T wave abnormality now evident in Anterolateral leads Confirmed by Carlin Nest [phone]) on 03/21/2023 6:29:06 PM    Cardiac Studies & Procedures     STRESS TESTS  MYOCARDIAL PERFUSION IMAGING 11/03/2020  Narrative  Nuclear stress EF: 85%.  There was no ST segment deviation noted during stress.  The study is normal.  This is a low risk study.  The left ventricular ejection fraction is normal (55-65%).  ECHOCARDIOGRAM  ECHOCARDIOGRAM COMPLETE 09/11/2022  Narrative ECHOCARDIOGRAM REPORT    Patient Name:   Denise Francis Date of Exam: 09/11/2022 Medical Rec #:  [id number]          Height:       65.5 in Accession #:    [id number]         Weight:       190.0 lb Date of Birth:  06-11-1935          BSA:           1.947 m Patient Age:    86 years           BP:           150/80 mmHg Patient Gender: F                  HR:           83 bpm. Exam Location:  [place]  Procedure: 2D Echo, Cardiac Doppler, Color Doppler and Strain Analysis  Indications:    Dyspnea R06.00  History:        Patient has prior history of Echocardiogram examinations, most recent 11/11/2020. Arrythmias:Atrial Fibrillation, Signs/Symptoms:Edema and Fatigue; Risk Factors:Pulmonary hypertension and Diabetes.  Sonographer:    Lynwood Silvas RDCS Referring Phys: [phone] DELON BROCKS Donyetta Ogletree  IMPRESSIONS   1. Left ventricular ejection fraction, by estimation, is 55 to 60%. The left ventricle has normal function. The left ventricle has no regional wall motion abnormalities. There is moderate concentric left ventricular hypertrophy. Left ventricular diastolic parameters are consistent with Grade I diastolic dysfunction (impaired relaxation). The average left ventricular global longitudinal strain is -13.1 %. The global longitudinal strain is abnormal. 2. Right ventricular systolic function is mildly reduced. The right ventricular size is normal. There is normal pulmonary artery systolic pressure. 3. The mitral valve is normal in structure. No evidence of mitral valve regurgitation. No evidence of mitral stenosis. 4. The aortic valve is tricuspid. Aortic valve regurgitation is mild. Aortic valve sclerosis is present, with no evidence of aortic valve stenosis. 5. Aortic DTA is NWV. 6. The inferior vena cava is normal in size with greater than 50% respiratory variability, suggesting right atrial pressure of 3 mmHg.  FINDINGS Left Ventricle: Left ventricular ejection fraction, by estimation, is 55 to 60%. The left ventricle has normal function. The left ventricle has no regional wall motion abnormalities. The average left ventricular global longitudinal strain is -13.1 %. The global longitudinal strain is abnormal. The left ventricular internal  cavity size was normal in size. There is moderate concentric left ventricular hypertrophy. Left ventricular diastolic parameters are consistent with Grade I diastolic dysfunction (impaired relaxation). Indeterminate filling pressures.  Right Ventricle: The right ventricular size is normal. No increase in right ventricular wall thickness. Right ventricular systolic function is mildly reduced. There is normal pulmonary artery systolic pressure. The tricuspid regurgitant velocity is 2.83 m/s, and with an assumed right atrial pressure of 3 mmHg, the estimated right ventricular systolic pressure is 35.0 mmHg.  Left Atrium: Left atrial size was normal in size.  Right Atrium: Right atrial size was normal in size.  Pericardium: There is no evidence of pericardial effusion.  Mitral Valve: The mitral valve is normal in structure. No evidence of mitral valve regurgitation. No evidence of mitral valve stenosis.  Tricuspid Valve: The tricuspid valve is normal in structure. Tricuspid valve regurgitation is mild . No evidence of tricuspid stenosis.  Aortic Valve: The aortic valve is tricuspid. Aortic valve regurgitation is mild. Aortic regurgitation PHT measures 660 msec.  Aortic valve sclerosis is present, with no evidence of aortic valve stenosis.  Pulmonic Valve: The pulmonic valve was normal in structure. Pulmonic valve regurgitation is not visualized. No evidence of pulmonic stenosis.  Aorta: The aortic arch was not well visualized, DTA is NWV and the aortic root and ascending aorta are structurally normal, with no evidence of dilitation.  Venous: The pulmonary veins were not well visualized. The inferior vena cava is normal in size with greater than 50% respiratory variability, suggesting right atrial pressure of 3 mmHg.  IAS/Shunts: No atrial level shunt detected by color flow Doppler.  Additional Comments: A device lead is visualized in the right atrium and right ventricle.   LEFT  VENTRICLE PLAX 2D LVIDd:         3.60 cm   Diastology LVIDs:         2.50 cm   LV e' medial:    5.33 cm/s LV PW:         1.40 cm   LV E/e' medial:  15.0 LV IVS:        1.70 cm   LV e' lateral:   7.40 cm/s LVOT diam:     2.00 cm   LV E/e' lateral: 10.8 LV SV:         77 LV SV Index:   40        2D Longitudinal Strain LVOT Area:     3.14 cm  2D Strain GLS Avg:     -13.1 %   RIGHT VENTRICLE             IVC RV Basal diam:  2.30 cm     IVC diam: 1.40 cm RV S prime:     10.40 cm/s TAPSE (M-mode): 1.4 cm  LEFT ATRIUM             Index        RIGHT ATRIUM           Index LA diam:        4.10 cm 2.11 cm/m   RA Area:     10.30 cm LA Vol (A2C):   33.1 ml 17.00 ml/m  RA Volume:   20.60 ml  10.58 ml/m LA Vol (A4C):   31.1 ml 15.98 ml/m LA Biplane Vol: 34.4 ml 17.67 ml/m AORTIC VALVE LVOT Vmax:   133.67 cm/s LVOT Vmean:  84.233 cm/s LVOT VTI:    0.246 m AI PHT:      660 msec  AORTA Ao Root diam: 2.70 cm Ao Asc diam:  3.35 cm Ao Desc diam: 2.50 cm  MV E velocity: 80.20 cm/s  TRICUSPID VALVE MV A velocity: 95.40 cm/s  TR Peak grad:   32.0 mmHg MV E/A ratio:  0.84        TR Vmax:        283.00 cm/s  SHUNTS Systemic VTI:  0.25 m Systemic Diam: 2.00 cm  Redell Leiter MD Electronically signed by Redell Leiter MD Signature Date/Time: 09/11/2022/5:07:31 PM    Final             Risk Assessment/Calculations:    CHA2DS2-VASc Score = 6   This indicates a 9.7% annual risk of stroke. The patient's score is based upon: CHF History: 0 HTN History: 1 Diabetes History: 0 Stroke History: 2 Vascular Disease History: 0 Age Score: 2 Gender Score: 1            Physical Exam:   VS:  BP 131/79 (BP Location: Left Arm, Patient Position:  Sitting, Cuff Size: Normal)   Pulse 80   Ht 5' 5 (1.651 m)   Wt 171 lb (77.6 kg)   SpO2 97%   BMI 28.46 kg/m    Wt Readings from Last 3 Encounters:  03/21/23 171 lb (77.6 kg)  12/06/22 182 lb (82.6 kg)  09/13/22 183 lb 12.8 oz (83.4 kg)     GEN: Well nourished, well developed in no acute distress NECK: No JVD; No carotid bruits CARDIAC: RRR, no murmurs, rubs, gallops RESPIRATORY:  Clear to auscultation without rales, wheezing or rhonchi  ABDOMEN: Soft, non-tender, non-distended EXTREMITIES: +2 pitting edema; No deformity   ASSESSMENT AND PLAN: .   PAF/hypercoagulable state-decision was made to not anticoagulate secondary to unstable gait and patient's preference per primary cardiologist, CHA2DS2-VASc score of 6.  She is maintaining sinus rhythm.   Hypertension -blood pressure today is controlled at 131/79, continue hydralazine  25 mg as needed for systolic greater than 160, continue amlodipine  5 mg daily.  CVA - no deficits, on Plavix for stroke prevention managed by her PCP.  Pedal edema -this continues to be bothersome for her.  We previously had her on Demadex  however this had been stopped secondary to kidney dysfunction.  Her feet are very edematous now she cannot really get a shoe on.  We will repeat BMET, consider low-dose diuretic once lab results are obtained.  Sick sinus syndrome syndrome/presence of PPM -most recent device interrogation reviewed battery and lead parameters were stable, followed by EP however she has not been able to transmit her readings recently.  Will arrange for her to follow-up with EP in [place].        Dispo: Follow-up with EP in [place], Labs per above, follow-up with general cardiology in 6 months, sooner if needed as she is also following closely with her PCP.  Signed, Delon JAYSON Hoover, NP

## 2023-03-21 ENCOUNTER — Encounter: Payer: Self-pay | Admitting: Cardiology

## 2023-03-21 ENCOUNTER — Ambulatory Visit: Payer: Medicare Other | Attending: Cardiology | Admitting: Cardiology

## 2023-03-21 VITALS — BP 131/79 | HR 80 | Ht 65.0 in | Wt 171.0 lb

## 2023-03-21 DIAGNOSIS — I10 Essential (primary) hypertension: Secondary | ICD-10-CM | POA: Diagnosis present

## 2023-03-21 DIAGNOSIS — Z950 Presence of cardiac pacemaker: Secondary | ICD-10-CM | POA: Diagnosis present

## 2023-03-21 DIAGNOSIS — I480 Paroxysmal atrial fibrillation: Secondary | ICD-10-CM | POA: Diagnosis not present

## 2023-03-21 DIAGNOSIS — D6859 Other primary thrombophilia: Secondary | ICD-10-CM | POA: Diagnosis not present

## 2023-03-21 DIAGNOSIS — I6389 Other cerebral infarction: Secondary | ICD-10-CM | POA: Diagnosis not present

## 2023-03-21 DIAGNOSIS — I495 Sick sinus syndrome: Secondary | ICD-10-CM

## 2023-03-21 NOTE — Patient Instructions (Signed)
 Medication Instructions:  Your physician recommends that you continue on your current medications as directed. Please refer to the Current Medication list given to you today.  *If you need a refill on your cardiac medications before your next appointment, please call your pharmacy*   Lab Work: You will have a basic metabolic panel and a complete blood panel.  If you have labs (blood work) drawn today and your tests are completely normal, you will receive your results only by: MyChart Message (if you have MyChart) OR A paper copy in the mail If you have any lab test that is abnormal or we need to change your treatment, we will call you to review the results.   Testing/Procedures: None Ordered   Follow-Up: At [facility], you and your health needs are our priority.  As part of our continuing mission to provide you with exceptional heart care, we have created designated Provider Care Teams.  These Care Teams include your primary Cardiologist (physician) and Advanced Practice Providers (APPs -  Physician Assistants and Nurse Practitioners) who all work together to provide you with the care you need, when you need it.  We recommend signing up for the patient portal called MyChart.  Sign up information is provided on this After Visit Summary.  MyChart is used to connect with patients for Virtual Visits (Telemedicine).  Patients are able to view lab/test results, encounter notes, upcoming appointments, etc.  Non-urgent messages can be sent to your provider as well.   To learn more about what you can do with MyChart, go to [email or web address].    Your next appointment:

## 2023-03-22 ENCOUNTER — Telehealth: Payer: Self-pay | Admitting: Cardiology

## 2023-03-22 ENCOUNTER — Other Ambulatory Visit (HOSPITAL_COMMUNITY): Payer: Self-pay

## 2023-03-22 DIAGNOSIS — I10 Essential (primary) hypertension: Secondary | ICD-10-CM

## 2023-03-22 LAB — CBC
Hematocrit: 39.3 % (ref 34.0–46.6)
Hemoglobin: 12.8 g/dL (ref 11.1–15.9)
MCH: 30.8 pg (ref 26.6–33.0)
MCHC: 32.6 g/dL (ref 31.5–35.7)
MCV: 95 fL (ref 79–97)
Platelets: 279 x10E3/uL (ref 150–450)
RBC: 4.16 x10E6/uL (ref 3.77–5.28)
RDW: 11.9 % (ref 11.7–15.4)
WBC: 5.9 x10E3/uL (ref 3.4–10.8)

## 2023-03-22 LAB — BASIC METABOLIC PANEL WITH GFR
BUN/Creatinine Ratio: 14 (ref 12–28)
BUN: 17 mg/dL (ref 8–27)
CO2: 22 mmol/L (ref 20–29)
Calcium: 9.5 mg/dL (ref 8.7–10.3)
Chloride: 104 mmol/L (ref 96–106)
Creatinine, Ser: 1.25 mg/dL — ABNORMAL HIGH (ref 0.57–1.00)
Glucose: 123 mg/dL — ABNORMAL HIGH (ref 70–99)
Potassium: 5.1 mmol/L (ref 3.5–5.2)
Sodium: 143 mmol/L (ref 134–144)
eGFR: 42 mL/min/1.73 — ABNORMAL LOW

## 2023-03-22 MED ORDER — TORSEMIDE 10 MG PO TABS
10.0000 mg | ORAL_TABLET | ORAL | 2 refills | Status: DC
  Filled 2023-03-22: qty 45, 90d supply, fill #0

## 2023-03-22 NOTE — Addendum Note (Signed)
 Addended by: Lonia Farber on: 03/22/2023 02:37 PM   Modules accepted: Orders

## 2023-03-22 NOTE — Telephone Encounter (Signed)
 Patient is returning call to discuss labs.

## 2023-03-22 NOTE — Telephone Encounter (Signed)
 LVM 03/22/2023

## 2023-03-22 NOTE — Telephone Encounter (Signed)
 Results reviewed with pt as per Wallis Bamberg NP's note.  Pt verbalized understanding and had no additional questions. Rx sent to pharmacy.  Routed to PCP.

## 2023-03-27 ENCOUNTER — Telehealth: Payer: Self-pay | Admitting: Cardiology

## 2023-03-27 MED ORDER — TORSEMIDE 10 MG PO TABS: 10.0000 mg | ORAL_TABLET | ORAL | 2 refills | Status: DC

## 2023-03-27 MED ORDER — POTASSIUM CHLORIDE ER 10 MEQ PO TBCR: 10.0000 meq | EXTENDED_RELEASE_TABLET | Freq: Every day | ORAL | 2 refills | Status: DC

## 2023-03-27 NOTE — Telephone Encounter (Signed)
 Spoke  to daughter, Delorise Jackson per [place]. Tori made aware that prescriptions have been sent to [facility]. She verbalized understanding and had no further questions.

## 2023-03-27 NOTE — Telephone Encounter (Signed)
 Pt c/o medication issue:  1. Name of Medication:  Demitex (?)   2. How are you currently taking this medication (dosage and times per day)?   Not started yet  3. Are you having a reaction (difficulty breathing--STAT)?   4. What is your medication issue?   Daughter Tacy) stated at patient's last visit she  was to start on this new medication for swelling.  Daughter wants to confirm the medication and have prescription sent to [facility] [phone] - [place], [place] - [address]

## 2023-03-27 NOTE — Telephone Encounter (Signed)
*  STAT* If patient is at the pharmacy, call can be transferred to refill team.   1. Which medications need to be refilled? (please list name of each medication and dose if known)   potassium chloride  (KLOR-CON ) 10 MEQ tablet    2. Would you like to learn more about the convenience, safety, & potential cost savings by using the [facility]?   3. Are you open to using the [facility] (Type [facility]. ).  4. Which pharmacy/location (including street and city if local pharmacy) is medication to be sent to?  [facility] [phone] - [place], [place] - [address]   5. Do they need a 30 day or 90 day supply?   30 day  Daughter stated patient is running out of the potassium medication.

## 2023-04-03 ENCOUNTER — Other Ambulatory Visit (HOSPITAL_COMMUNITY): Payer: Self-pay

## 2023-04-03 ENCOUNTER — Ambulatory Visit (INDEPENDENT_AMBULATORY_CARE_PROVIDER_SITE_OTHER): Payer: Medicare Other

## 2023-04-03 DIAGNOSIS — I495 Sick sinus syndrome: Secondary | ICD-10-CM

## 2023-04-05 NOTE — Discharge Instructions (Signed)
 Post Procedure Spinal Discharge Instruction Sheet  You may resume a regular diet and any medications that you routinely take (including pain medications) unless otherwise noted by MD.  No driving day of procedure.  Light activity throughout the rest of the day.  Do not do any strenuous work, exercise, bending or lifting.  The day following the procedure, you can resume normal physical activity but you should refrain from exercising or physical therapy for at least three days thereafter.  You may apply ice to the injection site, 20 minutes on, 20 minutes off, as needed. Do not apply ice directly to skin.    Common Side Effects:  Headaches- take your usual medications as directed by your physician.  Increase your fluid intake.  Caffeinated beverages may be helpful.  Lie flat in bed until your headache resolves.  Restlessness or inability to sleep- you may have trouble sleeping for the next few days.  Ask your referring physician if you need any medication for sleep.  Facial flushing or redness- should subside within a few days.  Increased pain- a temporary increase in pain a day or two following your procedure is not unusual.  Take your pain medication as prescribed by your referring physician.  Leg cramps  Please contact our office at [phone] for the following symptoms: Fever greater than 100 degrees. Headaches unresolved with medication after 2-3 days. Increased swelling, pain, or redness at injection site.   Thank you for visiting [facility] today.   YOU MAY RESUME YOUR PLAVIX TODAY

## 2023-04-09 ENCOUNTER — Ambulatory Visit
Admission: RE | Admit: 2023-04-09 | Discharge: 2023-04-09 | Disposition: A | Discharge status: Home / Self Care | Payer: Medicare Other | Source: Ambulatory Visit | Attending: Physician Assistant | Admitting: Physician Assistant

## 2023-04-09 DIAGNOSIS — M5416 Radiculopathy, lumbar region: Secondary | ICD-10-CM

## 2023-04-09 MED ORDER — METHYLPREDNISOLONE ACETATE 40 MG/ML INJ SUSP (RADIOLOG
80.0000 mg | Freq: Once | INTRAMUSCULAR | Status: AC
  Administered 2023-04-09: 80 mg via EPIDURAL

## 2023-04-09 MED ORDER — IOPAMIDOL (ISOVUE-M 200) INJECTION 41%
1.0000 mL | Freq: Once | INTRAMUSCULAR | Status: AC
  Administered 2023-04-09: 1 mL via EPIDURAL

## 2023-04-11 LAB — CUP PACEART REMOTE DEVICE CHECK
Battery Impedance: 1903 Ohm
Battery Remaining Longevity: 41 mo
Battery Voltage: 2.74 V
Brady Statistic AP VP Percent: 5 %
Brady Statistic AP VS Percent: 58 %
Brady Statistic AS VP Percent: 2 %
Brady Statistic AS VS Percent: 34 %
Date Time Interrogation Session: 20250120143028
Implantable Lead Connection Status: 753985
Implantable Lead Connection Status: 753985
Implantable Lead Implant Date: 20131107
Implantable Lead Implant Date: 20131107
Implantable Lead Location: 753859
Implantable Lead Location: 753860
Implantable Lead Model: 4092
Implantable Lead Model: 5076
Implantable Pulse Generator Implant Date: 20131107
Lead Channel Impedance Value: 472 Ohm
Lead Channel Impedance Value: 636 Ohm
Lead Channel Pacing Threshold Amplitude: 0.625 V
Lead Channel Pacing Threshold Amplitude: 0.625 V
Lead Channel Pacing Threshold Pulse Width: 0.4 ms
Lead Channel Pacing Threshold Pulse Width: 0.4 ms
Lead Channel Setting Pacing Amplitude: 1.5 V
Lead Channel Setting Pacing Amplitude: 2 V
Lead Channel Setting Pacing Pulse Width: 0.4 ms
Lead Channel Setting Sensing Sensitivity: 1.4 mV
Zone Setting Status: 755011
Zone Setting Status: 755011

## 2023-04-15 ENCOUNTER — Ambulatory Visit: Payer: Medicare Other | Attending: Cardiology | Admitting: Cardiology

## 2023-04-15 ENCOUNTER — Encounter: Payer: Self-pay | Admitting: Cardiology

## 2023-04-15 VITALS — BP 120/78 | HR 77 | Ht 65.0 in | Wt 168.2 lb

## 2023-04-15 DIAGNOSIS — I480 Paroxysmal atrial fibrillation: Secondary | ICD-10-CM | POA: Insufficient documentation

## 2023-04-15 DIAGNOSIS — I10 Essential (primary) hypertension: Secondary | ICD-10-CM | POA: Insufficient documentation

## 2023-04-15 DIAGNOSIS — I495 Sick sinus syndrome: Secondary | ICD-10-CM | POA: Diagnosis present

## 2023-04-15 LAB — CUP PACEART INCLINIC DEVICE CHECK
Battery Impedance: 1935 Ohm
Battery Remaining Longevity: 40 mo
Battery Voltage: 2.75 V
Brady Statistic AP VP Percent: 5 %
Brady Statistic AP VS Percent: 58 %
Brady Statistic AS VP Percent: 2 %
Brady Statistic AS VS Percent: 34 %
Date Time Interrogation Session: 20250127151746
Implantable Lead Connection Status: 753985
Implantable Lead Connection Status: 753985
Implantable Lead Implant Date: 20131107
Implantable Lead Implant Date: 20131107
Implantable Lead Location: 753859
Implantable Lead Location: 753860
Implantable Lead Model: 4092
Implantable Lead Model: 5076
Implantable Pulse Generator Implant Date: 20131107
Lead Channel Impedance Value: 425 Ohm
Lead Channel Impedance Value: 614 Ohm
Lead Channel Pacing Threshold Amplitude: 0.5 V
Lead Channel Pacing Threshold Amplitude: 0.5 V
Lead Channel Pacing Threshold Amplitude: 0.625 V
Lead Channel Pacing Threshold Amplitude: 0.75 V
Lead Channel Pacing Threshold Pulse Width: 0.4 ms
Lead Channel Pacing Threshold Pulse Width: 0.4 ms
Lead Channel Pacing Threshold Pulse Width: 0.4 ms
Lead Channel Pacing Threshold Pulse Width: 0.4 ms
Lead Channel Sensing Intrinsic Amplitude: 4 mV
Lead Channel Sensing Intrinsic Amplitude: 8 mV
Lead Channel Setting Pacing Amplitude: 1.5 V
Lead Channel Setting Pacing Amplitude: 2 V
Lead Channel Setting Pacing Pulse Width: 0.4 ms
Lead Channel Setting Sensing Sensitivity: 1.4 mV
Zone Setting Status: 755011
Zone Setting Status: 755011

## 2023-04-15 MED ORDER — METOPROLOL SUCCINATE ER 50 MG PO TB24: 50.0000 mg | ORAL_TABLET | Freq: Every day | ORAL | 11 refills | Status: AC

## 2023-04-15 NOTE — Patient Instructions (Signed)
Medication Instructions:  Your physician has recommended you make the following change in your medication:  START Metoprolol Succinate (Toprol) 50 mg daily at bedtime  *If you need a refill on your cardiac medications before your next appointment, please call your pharmacy*   Lab Work: None ordered   Testing/Procedures: None ordered   Follow-Up: At [facility], you and your health needs are our priority.  As part of our continuing mission to provide you with exceptional heart care, we have created designated Provider Care Teams.  These Care Teams include your primary Cardiologist (physician) and Advanced Practice Providers (APPs -  Physician Assistants and Nurse Practitioners) who all work together to provide you with the care you need, when you need it.  Remote monitoring is used to monitor your Pacemaker or ICD from home. This monitoring reduces the number of office visits required to check your device to one time per year. It allows [place] to keep an eye on the functioning of your device to ensure it is working properly. You are scheduled for a device check from home on 07/03/2023. You may send your transmission at any time that day. If you have a wireless device, the transmission will be sent automatically. After your physician reviews your transmission, you will receive a postcard with your next transmission date.  Your next appointment:   1 year(s)  The format for your next appointment:   In Person  Provider:   Loman Brooklyn, MD    Thank you for choosing [facility]!!   Dory Horn, RN [phone]    Other Instructions   Metoprolol Extended-Release Tablets What is this medication? METOPROLOL (me TOE proe lole) treats high blood pressure and heart failure. It may also be used to prevent chest pain (angina). It works by lowering your blood pressure and heart rate, making it easier for your heart to pump blood to the rest of your body. It belongs to a group of  medications called beta blockers. This medicine may be used for other purposes; ask your health care provider or pharmacist if you have questions. COMMON BRAND NAME(S): toprol, Toprol XL, Toprol-XL What should I tell my care team before I take this medication? They need to know if you have any of these conditions: Diabetes Heart or vessel disease, such as slow heartbeat, worsening heart failure, heart block, sick sinus syndrome, or Raynaud syndrome Kidney disease Liver disease Lung or breathing disease, such as asthma or emphysema Pheochromocytoma Thyroid disease An unusual or allergic reaction to metoprolol, other medications, foods, dyes, or preservatives Pregnant or trying to get pregnant Breastfeeding How should I use this medication? Take this medication by mouth. Take it as directed on the prescription label at the same time every day. Take it with food. You may cut the tablet in half if it is scored (has a line in the middle of it). This may help you swallow the tablet if the whole tablet is too big. Be sure to take both halves. Do not take just one-half of the tablet. Keep taking it unless your care team tells you to stop. Talk to your care team about the use of this medication in children. While it may be prescribed for children as young as 6 years for selected conditions, precautions do apply. Overdosage: If you think you have taken too much of this medicine contact a poison control center or emergency room at once. NOTE: This medicine is only for you. Do not share this medicine with others. What if  I miss a dose? If you miss a dose, take it as soon as you can. If it is almost time for your next dose, take only that dose. Do not take double or extra doses. What may interact with this medication? Certain medications for blood pressure, heart disease, irregular heartbeat NSAIDS, medications for pain and inflammation, such as ibuprofen or naproxen This list may not describe all  possible interactions. Give your health care provider a list of all the medicines, herbs, non-prescription drugs, or dietary supplements you use. Also tell them if you smoke, drink alcohol, or use illegal drugs. Some items may interact with your medicine. What should I watch for while using this medication? Visit your care team for regular checks on your progress. Check your blood pressure as directed. Know what your blood pressure should be and when to contact your care team. This medication may affect your coordination, reaction time, or judgment. Do not drive or operate machinery until you know how this medication affects you. Sit up or stand slowly to reduce the risk of dizzy or fainting spells. Drinking alcohol with this medication can increase the risk of these side effects. Do not suddenly stop taking this medication. This may increase your risk of side effects, such as chest pain and heart attack. If you no longer need to take this medication, your care team will lower the dose slowly over time to decrease the risk of side effects. If you are going to need surgery or a procedure, tell your care team that you are using this medication. This medication may affect blood glucose levels. It can also mask the symptoms of low blood sugar, such as a rapid heartbeat and tremors. If you have diabetes, it is important to check your blood sugar often while you are taking this medication. Do not treat yourself for coughs, colds, or pain while you are using this medication without asking your care team for advice. Some medications may increase your blood pressure. What side effects may I notice from receiving this medication? Side effects that you should report to your care team as soon as possible: Allergic reactions--skin rash, itching, hives, swelling of the face, lips, tongue, or throat Heart failure--shortness of breath, swelling of the ankles, feet, or hands, sudden weight gain, unusual weakness or  fatigue Low blood pressure--dizziness, feeling faint or lightheaded, blurry vision Raynaud's--cool, numb, or painful fingers or toes that may change color from pale, to blue, to red Slow heartbeat--dizziness, feeling faint or lightheaded, confusion, trouble breathing, unusual weakness or fatigue Worsening mood, feelings of depression Side effects that usually do not require medical attention (report to your care team if they continue or are bothersome): Change in sex drive or performance Diarrhea Dizziness Fatigue Headache This list may not describe all possible side effects. Call your doctor for medical advice about side effects. You may report side effects to FDA at 1-800-FDA-1088. Where should I keep my medication? Keep out of the reach of children and pets. Store at room temperature between 20 and 25 degrees C (68 and 77 degrees F). Throw away any unused medication after the expiration date. NOTE: This sheet is a summary. It may not cover all possible information. If you have questions about this medicine, talk to your doctor, pharmacist, or health care provider.  2024 Elsevier/Gold Standard (2022-03-05 00:00:00)

## 2023-04-15 NOTE — Progress Notes (Signed)
  Electrophysiology Office Note:   Date:  04/15/2023  ID:  Denise Francis, DOB 03/28/1935, MRN [id number]  Primary Cardiologist: Garwin Brothers, MD Primary Heart Failure: None Electrophysiologist: Exa Bomba Jorja Loa, MD      History of Present Illness:   Denise Francis is a 88 y.o. female with h/o hypertension, pulmonary hypertension, polymyalgia rheumatica, sick sinus syndrome seen today for routine electrophysiology followup.   Since last being seen in our clinic the patient reports doing well.  She has no chest pain or shortness of breath.  She is unaware of arrhythmias.  She has no acute complaints.  she denies chest pain, palpitations, dyspnea, PND, orthopnea, nausea, vomiting, dizziness, syncope, edema, weight gain, or early satiety.   Review of systems complete and found to be negative unless listed in HPI.      EP Information / Studies Reviewed:    EKG is not ordered today. EKG from 04/08/23 reviewed which showed atrial paced rhythm, inferior Q waves      PPM Interrogation-  reviewed in detail today,  See PACEART report.  Device History: Medtronic Dual Chamber PPM implanted for Sinus Node Dysfunction  Risk Assessment/Calculations:             Physical Exam:   VS:  BP 120/78   Pulse 77   Ht 5\' 5"  (1.651 m)   Wt 168 lb 3.2 oz (76.3 kg)   SpO2 94%   BMI 27.99 kg/m    Wt Readings from Last 3 Encounters:  04/15/23 168 lb 3.2 oz (76.3 kg)  03/21/23 171 lb (77.6 kg)  12/06/22 182 lb (82.6 kg)     GEN: Well nourished, well developed in no acute distress NECK: No JVD; No carotid bruits CARDIAC: Regular rate and rhythm, no murmurs, rubs, gallops RESPIRATORY:  Clear to auscultation without rales, wheezing or rhonchi  ABDOMEN: Soft, non-tender, non-distended EXTREMITIES:  No edema; No deformity   ASSESSMENT AND PLAN:    SND s/p Medtronic PPM  Normal PPM function See Pace Art report No changes today  2.  Hypertension: Currently well-controlled  3.   Obstructive sleep apnea: CPAP compliance encouraged  4.  Paroxysmal atrial fibrillation: CHA2DS2-VASc is at least 5.  Fortunately has had short episodes of atrial fibrillation.  Holding off on anticoagulation for now.  Denise Francis start Toprol-XL for rate control.  If she has further episodes that are much longer, with her history of stroke, would plan for anticoagulation.  Disposition:   Follow up with Dr. Elberta Fortis in 12 months  Signed, Tobie Perdue Jorja Loa, MD

## 2023-05-14 NOTE — Addendum Note (Signed)
 Addended by: Elease Etienne A on: 05/14/2023 01:55 PM   Modules accepted: Orders

## 2023-05-14 NOTE — Progress Notes (Signed)
 Remote pacemaker transmission.

## 2023-05-31 DIAGNOSIS — G8929 Other chronic pain: Secondary | ICD-10-CM

## 2023-05-31 HISTORY — DX: Other chronic pain: G89.29

## 2023-06-04 DIAGNOSIS — G8929 Other chronic pain: Secondary | ICD-10-CM

## 2023-06-04 HISTORY — DX: Other chronic pain: G89.29

## 2023-06-30 ENCOUNTER — Other Ambulatory Visit: Payer: Self-pay | Admitting: Cardiology

## 2023-08-08 ENCOUNTER — Other Ambulatory Visit: Payer: Self-pay | Admitting: Cardiology

## 2023-08-16 ENCOUNTER — Encounter: Payer: Self-pay | Admitting: *Deleted

## 2023-08-16 ENCOUNTER — Telehealth: Payer: Self-pay | Admitting: *Deleted

## 2023-08-16 MED ORDER — POTASSIUM CHLORIDE ER 10 MEQ PO TBCR: 10.0000 meq | EXTENDED_RELEASE_TABLET | Freq: Every day | ORAL | 2 refills | Status: DC

## 2023-08-16 NOTE — Telephone Encounter (Signed)
 Rx refill sent to pharmacy.

## 2023-09-16 NOTE — Progress Notes (Addendum)
 Cardiology Office Note:  .   Date:  09/18/2023  ID:  Denise Francis, DOB 08/27/1935, MRN [id number] PCP: Ofilia Lamar CROME, MD  [facility] Providers Cardiologist:  Stpehen Petitjean JONELLE Crape, MD Electrophysiologist:  Soyla Gladis Norton, MD    History of Present Illness: .   Denise Francis is a 88 y.o. female with a hx of paroxysmal atrial fibrillation not on anticoagulation due to unstable gait, CVA, sick sinus syndrome s/p PPM, hypertension, OSA on CPAP, dyslipidemia, polymyalgia rheumatica.   09/11/2022 echo EF 55-60%, moderate concentric LVH, grade I DD 09/11/22 VAS lower extremity US   negative for DVT 04/15/2022 device interrogation, stable battery and lead parameters 11/03/20 lexiscan  low risk normal study  Recently evaluated in office on 08/16/2022 With concerns of elevated blood pressure readings and new pedal edema, her blood pressure in our office was 140/80.  She was started on amlodipine  2.5 mg daily and advised to continue to check her blood pressure.  She was concerned with the edema in her legs possibly being a blood clot, we arranged for an ultrasound of her lower extremities which was negative for DVT.  Echocardiogram was arranged and completed on 09/11/2022  which revealed an EF of 55 to 60%, moderate concentric LVH, grade 1 DD.  Labs revealed creatinine 1.13, potassium 3.9, sodium 140.  Evaluated by myself on 03/20/2022 accompanied by her daughter, she had had a rough few months, dealing with ongoing back pain and cognitive decline, ultimately needed to complete SNF and he just returned home.  She was evaluated by EP on 04/15/2023, she had short episodes of atrial fibrillation, started on Toprol -XL for better rate control and suggested further episodes of atrial fibrillation plan on restarting an anticoagulant.  She presents today accompanied by her daughter for follow-up.  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  Her blood pressure is low today at 90/58, she is also lost approximately 30 to 40 pounds unintentionally.  She is living by herself however her children check on her very regularly and are considering hiring an aide for some time during the day.  She would like to get off some of her medications, she has a high pill burden and and struggling to take her medications. She denies chest pain, palpitations, dyspnea, pnd, orthopnea, n, v, dizziness, syncope, edema, weight gain, or early satiety.    ROS: Review of Systems  Constitutional: Negative.   HENT: Negative.    Eyes: Negative.   Respiratory: Negative.    Gastrointestinal: Negative.   Genitourinary: Negative.   Musculoskeletal:  Positive for joint pain.  Skin: Negative.   Neurological: Negative.   Endo/Heme/Allergies:  Bruises/bleeds easily.  Psychiatric/Behavioral: Negative.       Studies Reviewed: .        Cardiac Studies & Procedures   ______________________________________________________________________________________________   STRESS TESTS  MYOCARDIAL PERFUSION IMAGING 11/03/2020  Narrative  Nuclear stress EF: 85%.  There was no ST segment deviation noted during stress.  The study is normal.  This is a low risk study.  The left ventricular ejection fraction is normal (55-65%).   ECHOCARDIOGRAM  ECHOCARDIOGRAM COMPLETE 09/11/2022  Narrative ECHOCARDIOGRAM REPORT    Patient Name:   Denise Francis Date of Exam: 09/11/2022 Medical Rec #:  [id number]          Height:       65.5 in Accession #:    [id number]  Weight:       190.0 lb Date of Birth:  21-Sep-1935          BSA:          1.947 m Patient Age:    86 years           BP:           150/80 mmHg Patient Gender: F                  HR:           83 bpm. Exam Location:  [place]  Procedure: 2D Echo, Cardiac Doppler, Color Doppler and Strain Analysis  Indications:    Dyspnea R06.00  History:        Patient has prior history of  Echocardiogram examinations, most recent 11/11/2020. Arrythmias:Atrial Fibrillation, Signs/Symptoms:Edema and Fatigue; Risk Factors:Pulmonary hypertension and Diabetes.  Sonographer:    Lynwood Silvas RDCS Referring Phys: [phone] DELON BROCKS Kendre Jacinto  IMPRESSIONS   1. Left ventricular ejection fraction, by estimation, is 55 to 60%. The left ventricle has normal function. The left ventricle has no regional wall motion abnormalities. There is moderate concentric left ventricular hypertrophy. Left ventricular diastolic parameters are consistent with Grade I diastolic dysfunction (impaired relaxation). The average left ventricular global longitudinal strain is -13.1 %. The global longitudinal strain is abnormal. 2. Right ventricular systolic function is mildly reduced. The right ventricular size is normal. There is normal pulmonary artery systolic pressure. 3. The mitral valve is normal in structure. No evidence of mitral valve regurgitation. No evidence of mitral stenosis. 4. The aortic valve is tricuspid. Aortic valve regurgitation is mild. Aortic valve sclerosis is present, with no evidence of aortic valve stenosis. 5. Aortic DTA is NWV. 6. The inferior vena cava is normal in size with greater than 50% respiratory variability, suggesting right atrial pressure of 3 mmHg.  FINDINGS Left Ventricle: Left ventricular ejection fraction, by estimation, is 55 to 60%. The left ventricle has normal function. The left ventricle has no regional wall motion abnormalities. The average left ventricular global longitudinal strain is -13.1 %. The global longitudinal strain is abnormal. The left ventricular internal cavity size was normal in size. There is moderate concentric left ventricular hypertrophy. Left ventricular diastolic parameters are consistent with Grade I diastolic dysfunction (impaired relaxation). Indeterminate filling pressures.  Right Ventricle: The right ventricular size is normal. No increase in right  ventricular wall thickness. Right ventricular systolic function is mildly reduced. There is normal pulmonary artery systolic pressure. The tricuspid regurgitant velocity is 2.83 m/s, and with an assumed right atrial pressure of 3 mmHg, the estimated right ventricular systolic pressure is 35.0 mmHg.  Left Atrium: Left atrial size was normal in size.  Right Atrium: Right atrial size was normal in size.  Pericardium: There is no evidence of pericardial effusion.  Mitral Valve: The mitral valve is normal in structure. No evidence of mitral valve regurgitation. No evidence of mitral valve stenosis.  Tricuspid Valve: The tricuspid valve is normal in structure. Tricuspid valve regurgitation is mild . No evidence of tricuspid stenosis.  Aortic Valve: The aortic valve is tricuspid. Aortic valve regurgitation is mild. Aortic regurgitation PHT measures 660 msec. Aortic valve sclerosis is present, with no evidence of aortic valve stenosis.  Pulmonic Valve: The pulmonic valve was normal in structure. Pulmonic valve regurgitation is not visualized. No evidence of pulmonic stenosis.  Aorta: The aortic arch was not well visualized, DTA is NWV and the aortic root and ascending aorta are structurally  normal, with no evidence of dilitation.  Venous: The pulmonary veins were not well visualized. The inferior vena cava is normal in size with greater than 50% respiratory variability, suggesting right atrial pressure of 3 mmHg.  IAS/Shunts: No atrial level shunt detected by color flow Doppler.  Additional Comments: A device lead is visualized in the right atrium and right ventricle.   LEFT VENTRICLE PLAX 2D LVIDd:         3.60 cm   Diastology LVIDs:         2.50 cm   LV e' medial:    5.33 cm/s LV PW:         1.40 cm   LV E/e' medial:  15.0 LV IVS:        1.70 cm   LV e' lateral:   7.40 cm/s LVOT diam:     2.00 cm   LV E/e' lateral: 10.8 LV SV:         77 LV SV Index:   40        2D Longitudinal  Strain LVOT Area:     3.14 cm  2D Strain GLS Avg:     -13.1 %   RIGHT VENTRICLE             IVC RV Basal diam:  2.30 cm     IVC diam: 1.40 cm RV S prime:     10.40 cm/s TAPSE (M-mode): 1.4 cm  LEFT ATRIUM             Index        RIGHT ATRIUM           Index LA diam:        4.10 cm 2.11 cm/m   RA Area:     10.30 cm LA Vol (A2C):   33.1 ml 17.00 ml/m  RA Volume:   20.60 ml  10.58 ml/m LA Vol (A4C):   31.1 ml 15.98 ml/m LA Biplane Vol: 34.4 ml 17.67 ml/m AORTIC VALVE LVOT Vmax:   133.67 cm/s LVOT Vmean:  84.233 cm/s LVOT VTI:    0.246 m AI PHT:      660 msec  AORTA Ao Root diam: 2.70 cm Ao Asc diam:  3.35 cm Ao Desc diam: 2.50 cm  MV E velocity: 80.20 cm/s  TRICUSPID VALVE MV A velocity: 95.40 cm/s  TR Peak grad:   32.0 mmHg MV E/A ratio:  0.84        TR Vmax:        283.00 cm/s  SHUNTS Systemic VTI:  0.25 m Systemic Diam: 2.00 cm  Redell Leiter MD Electronically signed by Redell Leiter MD Signature Date/Time: 09/11/2022/5:07:31 PM    Final          ______________________________________________________________________________________________      Risk Assessment/Calculations:    CHA2DS2-VASc Score =     This indicates a  % annual risk of stroke. The patient's score is based upon:              Physical Exam:   VS:  BP (!) 90/58 (BP Location: Left Arm, Patient Position: Sitting)   Pulse 82   Ht 5' 5.5 (1.664 m)   Wt 159 lb (72.1 kg)   SpO2 92%   BMI 26.06 kg/m    Wt Readings from Last 3 Encounters:  09/18/23 159 lb (72.1 kg)  04/15/23 168 lb 3.2 oz (76.3 kg)  03/21/23 171 lb (77.6 kg)    GEN: Well nourished, well developed in no acute  distress NECK: No JVD; No carotid bruits CARDIAC: RRR, no murmurs, rubs, gallops RESPIRATORY:  Clear to auscultation without rales, wheezing or rhonchi  ABDOMEN: Soft, non-tender, non-distended EXTREMITIES: no edema; No deformity   ASSESSMENT AND PLAN: .   PAF/hypercoagulable state-decision was made to not  anticoagulate secondary to unstable gait and patient's preference per primary cardiologist, CHA2DS2-VASc score of 6.  She is maintaining sinus rhythm.  Brief episodes of atrial fibrillation noted on her device interrogation.   Hypertension -blood pressure today is low normal at 90/58, she is not necessarily symptomatic today however she does state her blood pressure has been running lower especially following her weight loss.  I think we need to stop her amlodipine  for now low normal today at 90/58, will discontinue her amlodipine .  CVA - no deficits, on Plavix for stroke prevention managed by her PCP.  Pedal edema -no appreciable edema today, continue Demadex  every other day.  Sick sinus syndrome syndrome/presence of PPM -device check 04/15/2023 revealed brief episodes of atrial fibrillation she was started on a beta-blocker.     Dispo: Discontinue potassium, discontinue amlodipine , return in 2 weeks for nurse visit for blood pressure check and then we will repeat a BMET at that time to see if her potassium is okay after discontinuing her potassium.  ** after her office visit we received request for preoperative evaluation for left total hip. We arranged a stress evaluation since she is recently wheel chair bound, it was normal, low risk and negative for ischemia. She is optimized as well as she can be from a cardiac perspective to proceed with orthopedic surgery. Would recommend she be continuously monitored on the inpatient side.     Signed, Delon JAYSON Hoover, NP

## 2023-09-18 ENCOUNTER — Ambulatory Visit: Attending: Cardiology | Admitting: Cardiology

## 2023-09-18 ENCOUNTER — Encounter: Payer: Self-pay | Admitting: Cardiology

## 2023-09-18 VITALS — BP 90/58 | HR 82 | Ht 65.5 in | Wt 159.0 lb

## 2023-09-18 DIAGNOSIS — D6859 Other primary thrombophilia: Secondary | ICD-10-CM | POA: Diagnosis not present

## 2023-09-18 DIAGNOSIS — I495 Sick sinus syndrome: Secondary | ICD-10-CM

## 2023-09-18 DIAGNOSIS — I480 Paroxysmal atrial fibrillation: Secondary | ICD-10-CM

## 2023-09-18 DIAGNOSIS — Z950 Presence of cardiac pacemaker: Secondary | ICD-10-CM | POA: Diagnosis present

## 2023-09-18 DIAGNOSIS — M1612 Unilateral primary osteoarthritis, left hip: Secondary | ICD-10-CM | POA: Insufficient documentation

## 2023-09-18 DIAGNOSIS — M8700 Idiopathic aseptic necrosis of unspecified bone: Secondary | ICD-10-CM | POA: Insufficient documentation

## 2023-09-18 DIAGNOSIS — Z01818 Encounter for other preprocedural examination: Secondary | ICD-10-CM

## 2023-09-18 DIAGNOSIS — I10 Essential (primary) hypertension: Secondary | ICD-10-CM

## 2023-09-18 HISTORY — DX: Idiopathic aseptic necrosis of unspecified bone: M87.00

## 2023-09-18 NOTE — Patient Instructions (Addendum)
 Medication Instructions:   STOP: Potassium  STOP: Amlodipine   2 weeks - come for nurse visit to have blood pressure checked and BMP   Lab Work: Your physician recommends that you return for lab work in: 2 weeks You need to have labs done when you are fasting.  You can come Monday through Friday 8:30 am to 12:00 pm and 1:15 to 4:30. You do not need to make an appointment as the order has already been placed. The labs you are going to have done are BMET    Testing/Procedures: None Ordered   Follow-Up: At [facility], you and your health needs are our priority.  As part of our continuing mission to provide you with exceptional heart care, we have created designated Provider Care Teams.  These Care Teams include your primary Cardiologist (physician) and Advanced Practice Providers (APPs -  Physician Assistants and Nurse Practitioners) who all work together to provide you with the care you need, when you need it.  We recommend signing up for the patient portal called MyChart.  Sign up information is provided on this After Visit Summary.  MyChart is used to connect with patients for Virtual Visits (Telemedicine).  Patients are able to view lab/test results, encounter notes, upcoming appointments, etc.  Non-urgent messages can be sent to your provider as well.   To learn more about what you can do with MyChart, go to [email or web address].    Your next appointment:   6 month(s)  The format for your next appointment:   In Person  Provider:   Delon Hoover, NP   Other Instructions NA

## 2023-09-24 ENCOUNTER — Telehealth: Payer: Self-pay | Admitting: Cardiology

## 2023-09-24 NOTE — Telephone Encounter (Signed)
 Daughter Sarina) stated patient has an appointment conflict for Nurse Visit on 7/16 and wants to reschedule.

## 2023-09-24 NOTE — Telephone Encounter (Signed)
 Called patient- she was confused to where her remote visit was performed. All is well. No action necessary/kbl 09/24/23

## 2023-10-02 ENCOUNTER — Telehealth: Payer: Self-pay

## 2023-10-02 ENCOUNTER — Ambulatory Visit: Attending: Cardiology

## 2023-10-02 ENCOUNTER — Other Ambulatory Visit: Payer: Self-pay

## 2023-10-02 DIAGNOSIS — I480 Paroxysmal atrial fibrillation: Secondary | ICD-10-CM

## 2023-10-02 DIAGNOSIS — Z01810 Encounter for preprocedural cardiovascular examination: Secondary | ICD-10-CM

## 2023-10-02 NOTE — Telephone Encounter (Signed)
   Pre-operative Risk Assessment    Patient Name: Denise Francis  DOB: 12-19-1935 MRN: [id number]   Date of last office visit: 09/18/23 Date of next office visit: 10/02/23 nurse visit   Request for Surgical Clearance    Procedure:  left total hip replacement  Date of Surgery:  Clearance 11/28/23                                Surgeon:  Dr. Elgin Ada III Surgeon's Group or Practice Name:  [facility] Phone number:  [phone] Fax number:  [phone]   Type of Clearance Requested:   - Medical    Type of Anesthesia:  Not Indicated   Additional requests/questions:    SignedAnnabella LITTIE Sayres   10/02/2023, 3:13 PM

## 2023-10-02 NOTE — Telephone Encounter (Signed)
 LMOVM for pt to send a transmission and to give us  a call if she needs help sending a transmission.

## 2023-10-02 NOTE — Progress Notes (Signed)
   Nurse Visit   Date of Encounter: 10/02/2023 ID: TINSLEIGH SLOVACEK, DOB 18-Dec-1935, MRN [id number]  PCP:  Ofilia Lamar CROME, MD   [facility] Providers Cardiologist:  Jennifer JONELLE Crape, MD Electrophysiologist:  Soyla Gladis Norton, MD      Visit Details   VS:  BP (!) 118/58 (BP Location: Left Arm, Patient Position: Sitting)   Pulse 80   Ht 5' 5 (1.651 m)   Wt 159 lb (72.1 kg)   SpO2 91%   BMI 26.46 kg/m  , BMI Body mass index is 26.46 kg/m.  Wt Readings from Last 3 Encounters:  10/02/23 159 lb (72.1 kg)  09/18/23 159 lb (72.1 kg)  04/15/23 168 lb 3.2 oz (76.3 kg)     Reason for visit: Perform Vitals Performed today: Vitals, Provider consulted and Education Changes (medications, testing, etc.) : Continue to stay off the Amlodipine  Length of Visit: 25 minutes    Medications Adjustments/Labs and Tests Ordered: No orders of the defined types were placed in this encounter.  No orders of the defined types were placed in this encounter.    Signed, Charlie LILLETTE Free, RN  10/02/2023 2:28 PM

## 2023-10-02 NOTE — Telephone Encounter (Signed)
 Delon,  You saw this patient on 09/18/2023. Per protocol we request that you comment on his cardiac risk to proceed with  left total hip replacement scheduled for 11/28/2023, since it has been less than 2 months since evaluated in the office. Please send your comment to P CV Pre-Op Pool.  Thank you, Lamarr Satterfield DNP, ANP, AACC.

## 2023-10-03 LAB — BASIC METABOLIC PANEL WITH GFR
BUN/Creatinine Ratio: 13 (ref 12–28)
BUN: 14 mg/dL (ref 8–27)
CO2: 20 mmol/L (ref 20–29)
Calcium: 9 mg/dL (ref 8.7–10.3)
Chloride: 104 mmol/L (ref 96–106)
Creatinine, Ser: 1.06 mg/dL — ABNORMAL HIGH (ref 0.57–1.00)
Glucose: 115 mg/dL — ABNORMAL HIGH (ref 70–99)
Potassium: 3.7 mmol/L (ref 3.5–5.2)
Sodium: 143 mmol/L (ref 134–144)
eGFR: 51 mL/min/1.73 — ABNORMAL LOW

## 2023-10-03 NOTE — Addendum Note (Signed)
 Addended by: ONEITA BERLINER on: 10/03/2023 09:07 AM   Modules accepted: Orders

## 2023-10-03 NOTE — Telephone Encounter (Signed)
 Recommendations reviewed with pt as per [facility] note.  Pt verbalized understanding and had no additional questions.  Reviewed instructions for lexi and MyChart message with instructions sent.

## 2023-10-04 ENCOUNTER — Ambulatory Visit: Payer: Self-pay | Admitting: Cardiology

## 2023-10-04 NOTE — Telephone Encounter (Signed)
 Lmovm for the 2nd time for th patient to give us  a call back.

## 2023-10-07 NOTE — Telephone Encounter (Signed)
 Pt will go to [place] with her monitor on 10/14/2023 to see Randall.

## 2023-10-14 ENCOUNTER — Ambulatory Visit: Payer: Self-pay | Admitting: Cardiology

## 2023-10-14 ENCOUNTER — Ambulatory Visit: Attending: Cardiology

## 2023-10-14 DIAGNOSIS — I495 Sick sinus syndrome: Secondary | ICD-10-CM | POA: Diagnosis present

## 2023-10-14 LAB — CUP PACEART INCLINIC DEVICE CHECK
Date Time Interrogation Session: 20250728134315
Implantable Lead Connection Status: 753985
Implantable Lead Connection Status: 753985
Implantable Lead Implant Date: 20131107
Implantable Lead Implant Date: 20131107
Implantable Lead Location: 753859
Implantable Lead Location: 753860
Implantable Lead Model: 4092
Implantable Lead Model: 5076
Implantable Pulse Generator Implant Date: 20131107
Lead Channel Impedance Value: 428 Ohm
Lead Channel Impedance Value: 645 Ohm
Lead Channel Pacing Threshold Amplitude: 0.5 V
Lead Channel Pacing Threshold Amplitude: 0.5 V
Lead Channel Pacing Threshold Pulse Width: 0.4 ms
Lead Channel Pacing Threshold Pulse Width: 0.4 ms
Lead Channel Sensing Intrinsic Amplitude: 5 mV
Lead Channel Sensing Intrinsic Amplitude: 8 mV

## 2023-10-14 NOTE — Progress Notes (Signed)
 Normal in-clinic dual chamber pacemaker check. Presenting Rhythm: AP/VS 79. Routine testing of thresholds, sensing, and impedance demonstrate stable parameters and no programming changes needed at this time. Brief AT episodes noted-no atrial fibrillation. Estimated longevity 30 months . Pt enrolled in remote follow-up but having difficulty transmitting.  Will follow up.

## 2023-10-14 NOTE — Patient Instructions (Signed)
Follow up per recall. 

## 2023-10-17 ENCOUNTER — Encounter: Payer: Self-pay | Admitting: Cardiology

## 2023-10-18 ENCOUNTER — Telehealth (HOSPITAL_COMMUNITY): Payer: Self-pay | Admitting: *Deleted

## 2023-10-18 NOTE — Telephone Encounter (Signed)
 Spoke to patient about her scheduled STRESS TEST on 10/23/23 at 11:00.She stated that she has a short term memory and may forget. I will give her a call as a reminder the  day  before the test.

## 2023-10-23 ENCOUNTER — Ambulatory Visit: Attending: Cardiology

## 2023-10-23 DIAGNOSIS — Z01810 Encounter for preprocedural cardiovascular examination: Secondary | ICD-10-CM | POA: Diagnosis present

## 2023-10-23 DIAGNOSIS — I480 Paroxysmal atrial fibrillation: Secondary | ICD-10-CM | POA: Diagnosis present

## 2023-10-23 LAB — MYOCARDIAL PERFUSION IMAGING
LV dias vol: 54 mL (ref 46–106)
LV sys vol: 9 mL
Nuc Stress EF: 83 %
Peak HR: 77 {beats}/min
Rest HR: 71 {beats}/min
Rest Nuclear Isotope Dose: 10.9 mCi
SDS: 0
SRS: 3
SSS: 3
ST Depression (mm): 0 mm
Stress Nuclear Isotope Dose: 31.6 mCi
TID: 0.85

## 2023-10-23 MED ORDER — REGADENOSON 0.4 MG/5ML IV SOLN
0.4000 mg | Freq: Once | INTRAVENOUS | Status: AC
  Administered 2023-10-23: 0.4 mg via INTRAVENOUS

## 2023-10-23 MED ORDER — TECHNETIUM TC 99M TETROFOSMIN IV KIT
31.6000 | PACK | Freq: Once | INTRAVENOUS | Status: AC | PRN
  Administered 2023-10-23: 31.6 via INTRAVENOUS

## 2023-10-23 MED ORDER — TECHNETIUM TC 99M TETROFOSMIN IV KIT
10.9000 | PACK | Freq: Once | INTRAVENOUS | Status: AC | PRN
  Administered 2023-10-23: 10.9 via INTRAVENOUS

## 2023-10-24 ENCOUNTER — Ambulatory Visit: Payer: Self-pay | Admitting: Cardiology

## 2023-10-25 NOTE — Telephone Encounter (Signed)
     Primary Cardiologist: Jennifer JONELLE Crape, MD  Chart reviewed as part of pre-operative protocol coverage. Given past medical history and time since last visit, based on ACC/AHA guidelines, Denise Francis would be at acceptable risk for the planned procedure without further cardiovascular testing.   Her RCRI is low risk, 0.9% risk of major cardiac event.  I will also forward to device clinic for recommendations.  I will route this recommendation to the requesting party via Epic fax function and remove from pre-op pool.  Please call with questions.  Josefa HERO. Agness Sibrian NP-C     10/25/2023, 4:45 PM Loc [facility] [address] Office [phone] Fax [phone]

## 2023-10-28 ENCOUNTER — Encounter: Payer: Self-pay | Admitting: Cardiology

## 2023-10-28 NOTE — Progress Notes (Signed)
 PERIOPERATIVE PRESCRIPTION FOR IMPLANTED CARDIAC DEVICE PROGRAMMING   Patient Information:  Patient: Denise Francis  MRN: [id number]  Date of Birth: [birth year]   Procedure:  left total hip replacement   Date of Surgery:  Clearance 11/28/23                                  Surgeon:  Dr. Elgin Ada III Surgeon's Group or Practice Name:  [facility] Phone number:  [phone] Fax number:  [phone]  Device Information:   Clinic EP Physician:   Dr. Soyla Norton Device Type:  Pacemaker Manufacturer and Phone #:  Medtronic: [id number] Pacemaker Dependent?:  No Date of Last Device Check:  10/14/2023  Normal Device Function?:  Yes     Electrophysiologist's Recommendations:   Have magnet available. Provide continuous ECG monitoring when magnet is used or reprogramming is to be performed.  Procedure should not interfere with device function.  No device programming or magnet placement needed.  Per Device Clinic Standing Orders, Denise Francis  10/28/2023 1:19 PM

## 2023-11-12 ENCOUNTER — Telehealth: Payer: Self-pay | Admitting: Cardiology

## 2023-11-12 NOTE — Telephone Encounter (Signed)
 Pt's daughter would like to schedule EKG before procedure on 9/11

## 2023-11-12 NOTE — Telephone Encounter (Signed)
 Spoke with pt and advised that dgt called regarding an EKG but she is not on her DPR. Pt states that she has been cleared for surgery.

## 2023-11-15 ENCOUNTER — Other Ambulatory Visit: Payer: Self-pay | Admitting: Cardiology

## 2024-01-01 ENCOUNTER — Encounter

## 2024-03-20 ENCOUNTER — Ambulatory Visit

## 2024-03-20 VITALS — BP 152/78 | HR 78 | Ht 65.0 in | Wt 159.4 lb

## 2024-03-20 DIAGNOSIS — I480 Paroxysmal atrial fibrillation: Secondary | ICD-10-CM | POA: Diagnosis present

## 2024-03-20 DIAGNOSIS — I110 Hypertensive heart disease with heart failure: Secondary | ICD-10-CM | POA: Diagnosis present

## 2024-03-20 DIAGNOSIS — E782 Mixed hyperlipidemia: Secondary | ICD-10-CM | POA: Insufficient documentation

## 2024-03-20 DIAGNOSIS — Z950 Presence of cardiac pacemaker: Secondary | ICD-10-CM | POA: Diagnosis present

## 2024-03-20 DIAGNOSIS — I10 Essential (primary) hypertension: Secondary | ICD-10-CM | POA: Diagnosis present

## 2024-03-20 MED ORDER — TORSEMIDE 10 MG PO TABS: ORAL_TABLET | ORAL | 3 refills | Status: AC

## 2024-03-20 NOTE — Assessment & Plan Note (Signed)
 Lipid panel from 01/08/2024 reviewed. Well-controlled. Continue atorvastatin  40 mg once daily and Zetia 10 mg once daily.

## 2024-03-20 NOTE — Progress Notes (Signed)
 "  Cardiology Consultation:    Date:  03/20/2024   ID:  Denise Francis, DOB [birth year], MRN [id number]  PCP:  Ofilia Lamar CROME, MD  Cardiologist:  Alean SAUNDERS Dorothie Wah, MD   Referring MD: Ofilia Lamar CROME, MD   No chief complaint on file.    ASSESSMENT AND PLAN:   Denise Francis 89 year old woman  history of hypertensive heart disease with preserved heart failure features, paroxysmal atrial fibrillation not on anticoagulation due to fall risk from unstable gait, prior CVA, sick sinus syndrome s/p permanent pacemaker, hypertension, OSA-on CPAP, dyslipidemia, polymyalgia rheumatica, CKD stage III, underwent left hip arthroplasty 11/28/2023 Last echocardiogram 09/11/2022 with normal LVEF 55 to 60%, moderate LVH, grade 1 diastolic dysfunction Last stress test 10/23/2023 done for preop workup noted no ischemia. Last pacemaker check 10/14/2023 normal functioning device with brief atrial tachycardia episodes without evidence of atrial fibrillation.   Here for follow-up visit.   Problem List Items Addressed This Visit       Cardiovascular and Mediastinum   Hypertensive heart disease with heart failure (HCC)   Well-controlled in general at home. Today elevated blood pressure reading she attributes to being upset with regards to discussion about home health caregivers.  Continue metoprolol  XL 50 mg once daily. Has hydralazine  to use as needed for blood pressure systolic greater than 160 mmHg. She has not required to take any hydralazine  and several months.  Associated with peripheral edema, in the setting of normal biventricular function with diastolic dysfunction.  Continue with low-salt diet less than 2 g daily. Continue with torsemide , she appears euvolemic and compensated at this time without any torsemide  in over a week. However she had significant pedal edema requiring daily torsemide . Recommend resume torsemide  10 mg on Monday Wednesday and Fridays with a dose of potassium chloride  10  mEq on the days she takes torsemide .  Will obtain blood work today for baseline CMP, magnesium and proBNP. SABRA       Relevant Medications   torsemide  (DEMADEX ) 10 MG tablet   PAF (paroxysmal atrial fibrillation) (HCC) - Primary   Paroxysmal A-fib on device check. Low burden. No significant A-fib on last device check July 2025. Not on anticoagulation due to fall risk.  Continue metoprolol  XL 50 mg once daily.       Relevant Medications   torsemide  (DEMADEX ) 10 MG tablet   Other Relevant Orders   EKG 12-Lead (Completed)   Basic metabolic panel with GFR   Magnesium     Other   Hyperlipidemia   Lipid panel from 01/08/2024 reviewed. Well-controlled. Continue atorvastatin  40 mg once daily and Zetia 10 mg once daily.       Relevant Medications   torsemide  (DEMADEX ) 10 MG tablet   Cardiac pacemaker in situ   History of sick sinus syndrome s/p permanent pacemaker. Last device clinic check July 2025. Notes difficulty keeping up with remote device checks. Prefers to have follow-up visits for device clinic here locally in [place]. Will have device clinic switch her appointments to [facility].       Return to clinic tentatively in 6 months.   History of Present Illness:    Denise Francis is a 89 y.o. female who is being seen today for a follow-up visit with me.  PCP is Ofilia Lamar CROME, MD.  Last visit with us  in the office was 09/18/2023 with Delon Hoover, NP-C.  Pleasant woman here for the visit today accompanied by her daughter.  She lives by herself.  Has  meals delivered through [employer] 5 days a week.  Has home health caregiver.  Has a history of paroxysmal atrial fibrillation not on anticoagulation due to fall risk from unstable gait, prior CVA, sick sinus syndrome s/p permanent pacemaker, hypertension, OSA-on CPAP, dyslipidemia, polymyalgia rheumatica, CKD stage III, underwent left hip arthroplasty 11/28/2023 Last echocardiogram 09/11/2022 with normal LVEF 55  to 60%, moderate LVH, grade 1 diastolic dysfunction Last stress test 10/23/2023 done for preop workup noted no ischemia. Last pacemaker check 10/14/2023 normal functioning device with brief atrial tachycardia episodes without evidence of atrial fibrillation.  EKG today in the office notes atrial paced ventricular sensed rhythm, heart rate 78/min.  Since her hip surgery she completed inpatient rehab and now is back home and has home physical therapy.  Mentions she has been upset with things in flux with regards to care providers at home.  From cardiac standpoint she denies any active symptoms of chest pain or shortness of breath.  No palpitations, lightheadedness, dizziness or syncopal episodes. Balance is still unsteady but significantly improved since her hip surgery.  Has had bilateral lower extremity edema for which she has been taking torsemide , on and off 10 mg up to once a day until a week ago.  Over the past week she is out of medications. Denies any orthopnea or paroxysmal nocturnal dyspnea.  Good compliance with her medications.  Lipid panel from 01/08/2024 noted total cholesterol 144, triglycerides 123, HDL 50, LDL 74. Sodium 144, potassium 4.1, BUN 17 and creatinine 1.12, eGFR 48 Normal transaminases and alkaline phosphatase Hemoglobin 11.3 and hematocrit 34, platelets 294.   Past Medical History:  Diagnosis Date   Acquired trigger finger 03/21/2016   Acquired unequal limb length 09/04/2018   Acute cystitis without hematuria 02/15/2017   Acute kidney injury 01/22/2023   01/22/2023: GFR 27     Acute right ankle pain 10/23/2018   Formatting of this note might be different from the original. 2020   Allergic rhinitis 04/07/2016   Altered gait 09/04/2018   Altered sensation due to recent stroke 08/25/2018   2020   Anemia, iron deficiency 04/07/2016   Anxiety 02/20/2023   02/20/2023     Arthritis 09/27/2015   Arthropathy    nos, multiple sites with polymyalgia rheumatica    Artificial cardiac pacemaker 09/15/2014   Overview:  Medtronic    Asthma    Asthma in adult, mild persistent, uncomplicated 05/21/2016   AVN (avascular necrosis of bone) (HCC) 09/18/2023   Bradycardia 02/02/2015   Cardiac pacemaker in situ 02/10/2020   Cerebral artery occlusion with cerebral infarction (HCC) 08/01/2018   Cervical radiculitis 03/21/2016   Chronic left hip pain 05/31/2023   Chronic pain of left knee 06/04/2023   Chronic pain of right knee 10/30/2012   Overview:  2017: onset 2017: ORTHO   CKD (chronic kidney disease), stage III (HCC) 07/05/2015   Confusion 01/22/2023   01/22/2023: ED eval, AKI/low K, not treated, added cefdinir for maybe UTI     CRI (chronic renal insufficiency), stage 3 (moderate)    Current moderate episode of major depressive disorder without prior episode (HCC) 10/17/2021   Formatting of this note might be different from the original. 10/17/2021   Diarrhea 12/11/2018   Formatting of this note might be different from the original. 2020   Diverticulitis of colon with hemorrhage    Dysarthria 08/01/2018   Edema of right lower leg 10/23/2018   Formatting of this note might be different from the original. 2020   Essential  hypertension 09/15/2014   GERD (gastroesophageal reflux disease)    Gout    H/O iron deficiency anemia    Hand discomfort 04/23/2013   Hemiparesis of right dominant side as late effect of cerebral infarction [facility]) 10/17/2021   Formatting of this note might be different from the original. 2020: stroke with right hemiparesis   Hemiplegia affecting right dominant side (HCC) 08/25/2018   2020   History of asthma 08/01/2018   History of depression 09/06/2015   Formatting of this note might be different from the original. 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 2017:   Nonhealing nonsurgical wound with fat layer exposed 11/28/2022   Orthostatic hypotension 02/10/2020   OSA on CPAP 08/12/2015   Osteoarthritis 08/01/2018   Osteoporosis 08/12/2015   PAF (paroxysmal atrial fibrillation) (HCC) 02/25/2020   Pain in the groin, left 01/09/2018   2019   Pain of both wrist joints 04/17/2016   Formatting of this note might be different from the original. 2017: onset 2017: ORTHO   Petechiae 10/30/2022   10/30/2022: arms and legs, new, on plavix     PMR (polymyalgia rheumatica) 04/30/2016   Polio 04/07/2016   Prediabetes 08/27/2017   Formatting of this note might be different from the original. 2020: 110/6.3   Presence of permanent cardiac pacemaker 08/20/2017   Pulmonary hypertension (HCC) 09/15/2014   Rectal bleeding 01/04/2017   Overview:  2018   Recurrent major depressive disorder, in remission 09/06/2015   RLS (restless legs syndrome) 08/12/2015   Screening for diabetes mellitus (DM) 03/03/2018   Sick sinus syndrome (HCC) 08/12/2015   Skin ulcer with fat layer exposed (HCC) 10/30/2022   10/30/2022 : left calf     Spinal stenosis of lumbar region with neurogenic claudication 01/05/2016   Stage 3b chronic kidney disease (CKD) (HCC) 07/05/2015   Steatorrhea 05/14/2018   2020   Stroke (HCC)    Jul 27 2018   Suspected COVID-19 virus infection 03/21/2020   Formatting of this note might be different from the original. 03/21/2020   Type 2 diabetes mellitus without complication, without long-term current use of insulin (HCC) 08/27/2017   Formatting of this note might be different from the original. 2020: 110/6.3 2021: 107/6.1 02/02/2021: 6.5    Urinary tract infection without hematuria 01/22/2023   01/22/2023; ED     UTI symptoms 03/14/2020   Vitamin D deficiency 03/25/2019    Past Surgical History:  Procedure Laterality Date   BACK SURGERY     x2   COLONOSCOPY  08/16/2010   Attempted. Moderate-to-severe sigmoid diverticulosis. Otherwise grossly normal attempted colonoscopy up to 40 cm (The patient had very poor preparation)   DENTAL SURGERY  12/24/2019   all teeth removed and a plate being placed in    ESOPHAGOGASTRODUODENOSCOPY  12/20/2010   Small hiatal hernia. Mild gastritis   KNEE SURGERY     WRIST SURGERY      Current Medications: Active Medications[1]   Allergies:   Amoxicillin, Clarithromycin, Flagyl [metronidazole], Mirtazapine, and Sulfamethoxazole-trimethoprim   Social History  Socioeconomic History   Marital status: Widowed    Spouse name: Not on file   Number of children: Not on file   Years of education: Not on file   Highest education level: Not on file  Occupational History   Not on file  Tobacco Use   Smoking status: Never   Smokeless tobacco: Never  Vaping Use   Vaping status: Never Used  Substance and Sexual Activity   Alcohol use: Yes    Comment: rare   Drug use: No   Sexual activity: Not on file  Other Topics Concern   Not on file  Social History Narrative   Not on file   Social Drivers of Health   Tobacco Use: Low Risk (03/20/2024)   Patient History    Smoking Tobacco Use: Never    Smokeless Tobacco Use: Never    Passive Exposure: Not on file  Financial Resource Strain: Not on file  Food Insecurity: Low Risk (01/08/2024)   Received from [facility]   Epic    Within the past 12 months, you worried that your food would run out before you got money to buy more: Never true    Within the past 12 months, the food you bought just didn't last and you didn't have money to get more. : Never true  Transportation Needs: No Transportation Needs (01/08/2024)   Received from [employer]    In the past 12 months, has lack of reliable transportation kept you from medical appointments, meetings, work or from getting things needed for daily living? : No  Physical Activity: Not on file  Stress: Not on file  Social Connections: Not on file  Depression (EYV7-0): Not on file  Alcohol Screen: Not on file  Housing: Low Risk (01/08/2024)   Received from [facility]   Epic    What is your living situation today?: I have a steady place to live    Think about the place you live. Do you have problems with any of the following? Choose all that apply:: None/None on this list  Utilities: Low Risk (01/08/2024)   Received from [facility]   Utilities    In the past 12 months has the electric, gas, oil, or water company threatened to shut off services in your home? : No  Health Literacy: Not on file     Family History: The patient's family history includes COPD in her mother; Heart attack in her mother; Heart failure in her father; Kidney failure in her father. There is no history of Colon cancer or Esophageal cancer. ROS:   Please see the history of present illness.    All 14 point review of systems negative except as described per history of present illness.  EKGs/Labs/Other Studies Reviewed:    The following studies were reviewed today:   EKG:  EKG Interpretation Date/Time:  Friday March 20 2024 14:28:14 EST Ventricular Rate:  78 PR Interval:  298 QRS Duration:  74 QT Interval:  380 QTC Calculation: 433 R Axis:   43  Text Interpretation: Atrial-paced rhythm with prolonged AV conduction Low voltage QRS Cannot rule out Anterior infarct (cited on or before 21-Mar-2023) When compared with ECG of 21-Mar-2023 15:47, Premature atrial complexes are no longer Present Criteria for Inferior infarct are no longer Present Confirmed by Liborio Hai reddy [phone]) on 03/20/2024 2:33:23 PM    Recent Labs: 03/21/2023: Hemoglobin 12.8; Platelets  279 10/02/2023: BUN 14; Creatinine, Ser 1.06; Potassium 3.7; Sodium 143  Recent  Lipid Panel No results found for: CHOL, TRIG, HDL, CHOLHDL, VLDL, LDLCALC, LDLDIRECT  Physical Exam:    VS:  BP (!) 158/84   Pulse 78   Ht 5' 5 (1.651 m)   Wt 159 lb 6 oz (72.3 kg)   SpO2 95%   BMI 26.52 kg/m     Wt Readings from Last 3 Encounters:  03/20/24 159 lb 6 oz (72.3 kg)  10/23/23 159 lb (72.1 kg)  10/02/23 159 lb (72.1 kg)     GENERAL:  Well nourished, well developed in no acute distress NECK: No JVD; No carotid bruits CARDIAC: RRR, S1 and S2 present, no murmurs, no rubs, no gallops CHEST:  Clear to auscultation without rales, wheezing or rhonchi  Extremities: No pitting pedal edema. Pulses bilaterally symmetric with radial 2+ and dorsalis pedis 2+ NEUROLOGIC:  Alert and oriented x 3  Medication Adjustments/Labs and Tests Ordered: Current medicines are reviewed at length with the patient today.  Concerns regarding medicines are outlined above.  Orders Placed This Encounter  Procedures   Basic metabolic panel with GFR   Magnesium   Pro b natriuretic peptide (BNP)   EKG 12-Lead   Meds ordered this encounter  Medications   torsemide  (DEMADEX ) 10 MG tablet    Sig: Take 10 mg (1 tablet) on Monday, Wednesday and Friday.    Dispense:  45 tablet    Refill:  3    Signed, Bailei Buist reddy Camiya Vinal, MD, MPH, [facility]. 03/20/2024 2:59 PM    [facility]     [1]  Current Meds  Medication Sig   atorvastatin  (LIPITOR) 40 MG tablet Take 1 tablet (40 mg total) by mouth daily.   calcitRIOL (ROCALTROL) 0.5 MCG capsule Take 0.5 mcg by mouth daily.   cetirizine (ZYRTEC) 10 MG tablet Take 10 mg by mouth daily.    clopidogrel (PLAVIX) 75 MG tablet Take 75 mg by mouth daily.   donepezil (ARICEPT) 10 MG tablet Take 10 mg by mouth daily.   ezetimibe (ZETIA) 10 MG tablet Take 10 mg by mouth daily.   famotidine (PEPCID) 40 MG tablet Take 40 mg by mouth daily.    ferrous sulfate 325 (65 FE) MG tablet Take 325 mg by mouth daily.   fluticasone (FLONASE) 50 MCG/ACT nasal spray Place 2 sprays into both nostrils daily.    hydrALAZINE  (APRESOLINE ) 25 MG tablet Take 1 tablet (25 mg total) by mouth daily as needed (Systolic BP over 839).   loratadine (CLARITIN) 10 MG tablet Take 10 mg by mouth daily.   MELATONIN MAXIMUM STRENGTH 5 MG TABS Take 10 mg by mouth at bedtime.   metoprolol  succinate (TOPROL  XL) 50 MG 24 hr tablet Take 1 tablet (50 mg total) by mouth at bedtime. Take with or immediately following a meal.   montelukast (SINGULAIR) 10 MG tablet Take 10 mg by mouth at bedtime.    mupirocin cream (BACTROBAN) 2 % Apply 1 Application topically 2 (two) times daily.   nystatin (MYCOSTATIN/NYSTOP) powder Apply 1 Application topically 3 (three) times daily.   potassium chloride  (KLOR-CON ) 10 MEQ tablet Take 10 mEq by mouth 3 (three) times a week.   predniSONE (DELTASONE) 1 MG tablet Take 4 tablets by mouth daily.   rOPINIRole (REQUIP) 0.25 MG tablet Take 0.5 mg by mouth daily.   traMADol (ULTRAM) 50 MG tablet Take 50 mg by mouth 2 (two) times daily between meals as needed for moderate pain (pain score 4-6).   [DISCONTINUED] torsemide  (DEMADEX ) 10 MG tablet TAKE  1 TABLET(10 MG) BY MOUTH EVERY OTHER DAY   "

## 2024-03-20 NOTE — Assessment & Plan Note (Signed)
 Paroxysmal A-fib on device check. Low burden. No significant A-fib on last device check July 2025. Not on anticoagulation due to fall risk.  Continue metoprolol  XL 50 mg once daily.

## 2024-03-20 NOTE — Assessment & Plan Note (Addendum)
 Well-controlled in general at home. Today elevated blood pressure reading she attributes to being upset with regards to discussion about home health caregivers.  Continue metoprolol  XL 50 mg once daily. Has hydralazine  to use as needed for blood pressure systolic greater than 160 mmHg. She has not required to take any hydralazine  and several months.  Associated with peripheral edema, in the setting of normal biventricular function with diastolic dysfunction.  Continue with low-salt diet less than 2 g daily. Continue with torsemide , she appears euvolemic and compensated at this time without any torsemide  in over a week. However she had significant pedal edema requiring daily torsemide . Recommend resume torsemide  10 mg on Monday Wednesday and Fridays with a dose of potassium chloride  10 mEq on the days she takes torsemide .  Will obtain blood work today for baseline CMP, magnesium and proBNP. Denise Francis

## 2024-03-20 NOTE — Assessment & Plan Note (Signed)
 History of sick sinus syndrome s/p permanent pacemaker. Last device clinic check July 2025. Notes difficulty keeping up with remote device checks. Prefers to have follow-up visits for device clinic here locally in [place]. Will have device clinic switch her appointments to [facility].

## 2024-03-20 NOTE — Patient Instructions (Signed)
 Medication Instructions:  Your physician has recommended you make the following change in your medication:   Take Torsemide  10 mg on Monday, Wednesday and Friday.  *If you need a refill on your cardiac medications before your next appointment, please call your pharmacy*   Lab Work: Your physician recommends that you have a BMP, magnesium and ProBNP today in the office.  If you have labs (blood work) drawn today and your tests are completely normal, you will receive your results only by: MyChart Message (if you have MyChart) OR A paper copy in the mail If you have any lab test that is abnormal or we need to change your treatment, we will call you to review the results.   Testing/Procedures: None ordered   Follow-Up: At [facility], you and your health needs are our priority.  As part of our continuing mission to provide you with exceptional heart care, we have created designated Provider Care Teams.  These Care Teams include your primary Cardiologist (physician) and Advanced Practice Providers (APPs -  Physician Assistants and Nurse Practitioners) who all work together to provide you with the care you need, when you need it.  We recommend signing up for the patient portal called MyChart.  Sign up information is provided on this After Visit Summary.  MyChart is used to connect with patients for Virtual Visits (Telemedicine).  Patients are able to view lab/test results, encounter notes, upcoming appointments, etc.  Non-urgent messages can be sent to your provider as well.   To learn more about what you can do with MyChart, go to [email or web address].    Your next appointment:   6 month(s)  The format for your next appointment:   In Person  Provider:   Alean Kobus, MD    Other Instructions none  Important Information About Sugar

## 2024-03-21 LAB — BASIC METABOLIC PANEL WITH GFR
BUN/Creatinine Ratio: 14 (ref 12–28)
BUN: 17 mg/dL (ref 8–27)
CO2: 23 mmol/L (ref 20–29)
Calcium: 9 mg/dL (ref 8.7–10.3)
Chloride: 104 mmol/L (ref 96–106)
Creatinine, Ser: 1.18 mg/dL — ABNORMAL HIGH (ref 0.57–1.00)
Glucose: 98 mg/dL (ref 70–99)
Potassium: 4.4 mmol/L (ref 3.5–5.2)
Sodium: 142 mmol/L (ref 134–144)
eGFR: 45 mL/min/1.73 — ABNORMAL LOW

## 2024-03-21 LAB — PRO B NATRIURETIC PEPTIDE: NT-Pro BNP: 345 pg/mL (ref 0–738)

## 2024-03-21 LAB — MAGNESIUM: Magnesium: 2.1 mg/dL (ref 1.6–2.3)

## 2024-03-29 ENCOUNTER — Other Ambulatory Visit: Payer: Self-pay | Admitting: Cardiology

## 2024-04-01 ENCOUNTER — Encounter

## 2024-04-06 ENCOUNTER — Ambulatory Visit: Attending: Cardiology | Admitting: Cardiology

## 2024-04-06 ENCOUNTER — Encounter: Payer: Self-pay | Admitting: Cardiology

## 2024-04-06 VITALS — BP 150/82 | HR 88 | Ht 65.0 in | Wt 157.0 lb

## 2024-04-06 DIAGNOSIS — G4733 Obstructive sleep apnea (adult) (pediatric): Secondary | ICD-10-CM | POA: Diagnosis not present

## 2024-04-06 DIAGNOSIS — I10 Essential (primary) hypertension: Secondary | ICD-10-CM | POA: Diagnosis not present

## 2024-04-06 DIAGNOSIS — I480 Paroxysmal atrial fibrillation: Secondary | ICD-10-CM | POA: Diagnosis not present

## 2024-04-06 DIAGNOSIS — I495 Sick sinus syndrome: Secondary | ICD-10-CM | POA: Diagnosis not present

## 2024-04-06 NOTE — Progress Notes (Signed)
" °  Electrophysiology Office Note:   Date:  04/06/2024  ID:  Denise Francis, DOB 30-Aug-1935, MRN [id number]  Primary Cardiologist: Jennifer JONELLE Crape, MD Primary Heart Failure: None Electrophysiologist: Marshall Kampf Gladis Norton, MD      History of Present Illness:   Denise Francis is a 89 y.o. female with h/o hypertension, pulmonary hypertension, polymyalgia rheumatica, sick sinus syndrome seen today for routine electrophysiology followup.   Discussed the use of AI scribe software for clinical note transcription with the patient, who gave verbal consent to proceed.  History of Present Illness Denise Francis is an 89 year old female who presents for routine follow-up of her cardiac device.  She experienced a single episode of indigestion this week, described as the first occurrence of such symptoms. The indigestion was accompanied by sharp pains, which she had never experienced before. These symptoms occurred on a night when she slept on the couch with her dog due to a lack of heat in her home. No further episodes have occurred since that night.  She mentions that her cardiac device has not been functioning correctly for about a year. She prefers attending appointments at the current location rather than traveling to [place], as it is difficult for her to find someone to accompany her and she often has to pay for assistance.  she denies chest pain, palpitations, dyspnea, PND, orthopnea, nausea, vomiting, dizziness, syncope, edema, weight gain, or early satiety.   Review of systems complete and found to be negative unless listed in HPI.      EP Information / Studies Reviewed:    EKG is not ordered today. EKG from 03/20/2024 reviewed which showed atrial paced      PPM Interrogation-  reviewed in detail today,  See PACEART report.  Arrhythmia/Device History: Medtronic Dual Chamber PPM implanted  for Sinus Node Dysfunction  Physical Exam:   VS:  BP (!) 150/82   Pulse 88   Ht 5'  5 (1.651 m)   Wt 157 lb (71.2 kg)   SpO2 97%   BMI 26.13 kg/m    Wt Readings from Last 3 Encounters:  04/06/24 157 lb (71.2 kg)  03/20/24 159 lb 6 oz (72.3 kg)  10/23/23 159 lb (72.1 kg)     GEN: Well nourished, well developed in no acute distress NECK: No JVD; No carotid bruits CARDIAC: Regular rate and rhythm, no murmurs, rubs, gallops RESPIRATORY:  Clear to auscultation without rales, wheezing or rhonchi  ABDOMEN: Soft, non-tender, non-distended EXTREMITIES:  No edema; No deformity   ASSESSMENT AND PLAN:    SND s/p Medtronic PPM  Normal PPM function See Pace Art report No changes today  2.  Hypertension: Mildly elevated today.  Plan per PCP.  3.  Obstructive sleep apnea: CPAP compliance encouraged  4.  Paroxysmal atrial fibrillation: CHA2DS2-VASc of at least 5.  Has had short episodes of atrial fibrillation.  No anticoagulation for now.  Disposition:   Follow up with EP Team in 12 months  Signed, Kataleia Quaranta Gladis Norton, MD  "

## 2024-04-06 NOTE — Patient Instructions (Signed)
 Medication Instructions:  Your physician recommends that you continue on your current medications as directed. Please refer to the Current Medication list given to you today.  *If you need a refill on your cardiac medications before your next appointment, please call your pharmacy*  Follow-Up: At [facility], you and your health needs are our priority.  As part of our continuing mission to provide you with exceptional heart care, our providers are all part of one team.  This team includes your primary Cardiologist (physician) and Advanced Practice Providers or APPs (Physician Assistants and Nurse Practitioners) who all work together to provide you with the care you need, when you need it.  Your next appointment:   3 month(s)  Provider:   Device Clinic

## 2024-04-07 ENCOUNTER — Ambulatory Visit: Payer: Self-pay | Admitting: Cardiology

## 2024-04-07 LAB — CUP PACEART INCLINIC DEVICE CHECK
Battery Impedance: 2777 Ohm
Battery Remaining Longevity: 26 mo
Battery Voltage: 2.71 V
Brady Statistic AP VP Percent: 17 %
Brady Statistic AP VS Percent: 75 %
Brady Statistic AS VP Percent: 1 %
Brady Statistic AS VS Percent: 6 %
Date Time Interrogation Session: 20260119125900
Implantable Lead Connection Status: 753985
Implantable Lead Connection Status: 753985
Implantable Lead Implant Date: 20131107
Implantable Lead Implant Date: 20131107
Implantable Lead Location: 753859
Implantable Lead Location: 753860
Implantable Lead Model: 4092
Implantable Lead Model: 5076
Implantable Pulse Generator Implant Date: 20131107
Lead Channel Impedance Value: 429 Ohm
Lead Channel Impedance Value: 623 Ohm
Lead Channel Pacing Threshold Amplitude: 0.5 V
Lead Channel Pacing Threshold Amplitude: 0.625 V
Lead Channel Pacing Threshold Pulse Width: 0.4 ms
Lead Channel Pacing Threshold Pulse Width: 0.4 ms
Lead Channel Sensing Intrinsic Amplitude: 4 mV
Lead Channel Sensing Intrinsic Amplitude: 5.6 mV
Lead Channel Setting Pacing Amplitude: 1.5 V
Lead Channel Setting Pacing Amplitude: 2 V
Lead Channel Setting Pacing Pulse Width: 0.4 ms
Lead Channel Setting Sensing Sensitivity: 1.4 mV
Zone Setting Status: 755011
Zone Setting Status: 755011

## 2024-06-29 ENCOUNTER — Ambulatory Visit

## 2024-07-01 ENCOUNTER — Encounter

## 2024-07-13 ENCOUNTER — Ambulatory Visit: Admitting: Cardiology

## 2024-09-30 ENCOUNTER — Encounter

## 2024-12-30 ENCOUNTER — Encounter

## 2025-03-31 ENCOUNTER — Encounter
